# Patient Record
Sex: Female | Born: 1965
Health system: Southern US, Community
[De-identification: ages and names within clinical notes are randomized; demographics above are authoritative.]

## PROBLEM LIST (undated history)

## (undated) DIAGNOSIS — K649 Unspecified hemorrhoids: Secondary | ICD-10-CM

## (undated) DIAGNOSIS — N200 Calculus of kidney: Secondary | ICD-10-CM

## (undated) DIAGNOSIS — Z9289 Personal history of other medical treatment: Secondary | ICD-10-CM

## (undated) DIAGNOSIS — C50919 Malignant neoplasm of unspecified site of unspecified female breast: Secondary | ICD-10-CM

## (undated) DIAGNOSIS — Z923 Personal history of irradiation: Secondary | ICD-10-CM

## (undated) DIAGNOSIS — B019 Varicella without complication: Secondary | ICD-10-CM

## (undated) DIAGNOSIS — K635 Polyp of colon: Secondary | ICD-10-CM

## (undated) DIAGNOSIS — Z1379 Encounter for other screening for genetic and chromosomal anomalies: Secondary | ICD-10-CM

## (undated) DIAGNOSIS — C50911 Malignant neoplasm of unspecified site of right female breast: Secondary | ICD-10-CM

## (undated) DIAGNOSIS — C4491 Basal cell carcinoma of skin, unspecified: Secondary | ICD-10-CM

## (undated) DIAGNOSIS — C50419 Malignant neoplasm of upper-outer quadrant of unspecified female breast: Secondary | ICD-10-CM

## (undated) DIAGNOSIS — N3090 Cystitis, unspecified without hematuria: Secondary | ICD-10-CM

## (undated) DIAGNOSIS — N920 Excessive and frequent menstruation with regular cycle: Secondary | ICD-10-CM

## (undated) DIAGNOSIS — Z853 Personal history of malignant neoplasm of breast: Secondary | ICD-10-CM

## (undated) DIAGNOSIS — C801 Malignant (primary) neoplasm, unspecified: Secondary | ICD-10-CM

## (undated) DIAGNOSIS — G43909 Migraine, unspecified, not intractable, without status migrainosus: Secondary | ICD-10-CM

## (undated) HISTORY — DX: Polyp of colon: K63.5

## (undated) HISTORY — DX: Migraine, unspecified, not intractable, without status migrainosus: G43.909

## (undated) HISTORY — DX: Calculus of kidney: N20.0

## (undated) HISTORY — DX: Encounter for other screening for genetic and chromosomal anomalies: Z13.79

## (undated) HISTORY — DX: Malignant neoplasm of unspecified site of unspecified female breast: C50.919

## (undated) HISTORY — DX: Varicella without complication: B01.9

## (undated) HISTORY — DX: Basal cell carcinoma of skin, unspecified: C44.91

## (undated) HISTORY — DX: Cystitis, unspecified without hematuria: N30.90

## (undated) HISTORY — DX: Excessive and frequent menstruation with regular cycle: N92.0

## (undated) HISTORY — DX: Malignant neoplasm of upper-outer quadrant of unspecified female breast: C50.419

## (undated) HISTORY — DX: Malignant (primary) neoplasm, unspecified: C80.1

## (undated) HISTORY — DX: Unspecified hemorrhoids: K64.9

## (undated) HISTORY — DX: Personal history of other medical treatment: Z92.89

## (undated) HISTORY — DX: Personal history of malignant neoplasm of breast: Z85.3

## (undated) HISTORY — DX: Malignant neoplasm of unspecified site of right female breast: C50.911

---

## 1999-07-09 HISTORY — PX: DILATION AND CURETTAGE OF UTERUS: SHX78

## 2007-11-12 ENCOUNTER — Ambulatory Visit: Payer: Self-pay | Admitting: Unknown Physician Specialty

## 2009-05-08 DIAGNOSIS — C4491 Basal cell carcinoma of skin, unspecified: Secondary | ICD-10-CM

## 2009-05-08 HISTORY — DX: Basal cell carcinoma of skin, unspecified: C44.91

## 2009-05-08 HISTORY — PX: OTHER SURGICAL HISTORY: SHX169

## 2009-07-17 ENCOUNTER — Ambulatory Visit: Payer: Self-pay | Admitting: Unknown Physician Specialty

## 2010-09-07 ENCOUNTER — Ambulatory Visit: Payer: Self-pay | Admitting: Unknown Physician Specialty

## 2011-07-09 DIAGNOSIS — C801 Malignant (primary) neoplasm, unspecified: Secondary | ICD-10-CM

## 2011-07-09 DIAGNOSIS — K635 Polyp of colon: Secondary | ICD-10-CM

## 2011-07-09 DIAGNOSIS — Z923 Personal history of irradiation: Secondary | ICD-10-CM

## 2011-07-09 DIAGNOSIS — Z853 Personal history of malignant neoplasm of breast: Secondary | ICD-10-CM

## 2011-07-09 HISTORY — PX: LASIK: SHX215

## 2011-07-09 HISTORY — PX: BREAST SURGERY: SHX581

## 2011-07-09 HISTORY — DX: Personal history of irradiation: Z92.3

## 2011-07-09 HISTORY — DX: Personal history of malignant neoplasm of breast: Z85.3

## 2011-07-09 HISTORY — DX: Malignant (primary) neoplasm, unspecified: C80.1

## 2011-07-09 HISTORY — DX: Polyp of colon: K63.5

## 2011-07-09 HISTORY — PX: COLONOSCOPY: SHX174

## 2011-10-07 HISTORY — PX: BREAST BIOPSY: SHX20

## 2011-10-14 ENCOUNTER — Ambulatory Visit: Payer: Self-pay | Admitting: Obstetrics and Gynecology

## 2011-10-28 ENCOUNTER — Ambulatory Visit: Payer: Self-pay | Admitting: General Surgery

## 2011-11-05 ENCOUNTER — Ambulatory Visit: Payer: Self-pay | Admitting: General Surgery

## 2011-11-05 DIAGNOSIS — C50419 Malignant neoplasm of upper-outer quadrant of unspecified female breast: Secondary | ICD-10-CM

## 2011-11-05 HISTORY — PX: BREAST LUMPECTOMY: SHX2

## 2011-11-05 HISTORY — DX: Malignant neoplasm of upper-outer quadrant of unspecified female breast: C50.419

## 2011-11-05 LAB — PREGNANCY, URINE: Pregnancy Test, Urine: NEGATIVE m[IU]/mL

## 2011-11-06 LAB — PATHOLOGY REPORT

## 2011-11-14 ENCOUNTER — Ambulatory Visit: Payer: Self-pay | Admitting: Oncology

## 2011-12-07 ENCOUNTER — Ambulatory Visit: Payer: Self-pay | Admitting: Oncology

## 2011-12-16 LAB — CBC CANCER CENTER
Basophil %: 0.8 %
Lymphocyte %: 34 %
Monocyte #: 0.8 x10 3/mm (ref 0.2–0.9)
Monocyte %: 11.6 %
Neutrophil #: 3.4 x10 3/mm (ref 1.4–6.5)
Neutrophil %: 50.3 %
RBC: 4.32 10*6/uL (ref 3.80–5.20)
WBC: 6.7 x10 3/mm (ref 3.6–11.0)

## 2011-12-16 LAB — COMPREHENSIVE METABOLIC PANEL
BUN: 14 mg/dL (ref 7–18)
Bilirubin,Total: 0.3 mg/dL (ref 0.2–1.0)
Chloride: 104 mmol/L (ref 98–107)
Co2: 28 mmol/L (ref 21–32)
Creatinine: 0.54 mg/dL — ABNORMAL LOW (ref 0.60–1.30)
EGFR (African American): >60
Glucose: 96 mg/dL (ref 65–99)
Potassium: 3.8 mmol/L (ref 3.5–5.1)
SGOT(AST): 24 U/L (ref 15–37)
SGPT (ALT): 34 U/L
Sodium: 140 mmol/L (ref 136–145)
Total Protein: 7.6 g/dL (ref 6.4–8.2)

## 2012-01-06 ENCOUNTER — Ambulatory Visit: Payer: Self-pay | Admitting: Oncology

## 2012-01-06 LAB — CBC CANCER CENTER
Basophil #: 0.1 x10 3/mm (ref 0.0–0.1)
Basophil %: 0.9 %
HGB: 12.3 g/dL (ref 12.0–16.0)
Lymphocyte %: 42.1 %
MCH: 30.4 pg (ref 26.0–34.0)
MCV: 95 fL (ref 80–100)
Monocyte #: 0.8 x10 3/mm (ref 0.2–0.9)
Monocyte %: 13.1 %
Neutrophil #: 2.4 x10 3/mm (ref 1.4–6.5)
Neutrophil %: 40.3 %
RBC: 4.05 10*6/uL (ref 3.80–5.20)
RDW: 13.4 % (ref 11.5–14.5)
WBC: 5.9 x10 3/mm (ref 3.6–11.0)

## 2012-01-13 LAB — CBC CANCER CENTER
Basophil #: 0 x10 3/mm (ref 0.0–0.1)
Eosinophil #: 0.2 x10 3/mm (ref 0.0–0.7)
Eosinophil %: 4 %
HCT: 38.7 % (ref 35.0–47.0)
HGB: 12.7 g/dL (ref 12.0–16.0)
Lymphocyte #: 2 x10 3/mm (ref 1.0–3.6)
Lymphocyte %: 33.6 %
MCHC: 32.9 g/dL (ref 32.0–36.0)
Monocyte #: 0.8 x10 3/mm (ref 0.2–0.9)
Neutrophil #: 2.8 x10 3/mm (ref 1.4–6.5)
RBC: 4.16 10*6/uL (ref 3.80–5.20)
RDW: 13.1 % (ref 11.5–14.5)

## 2012-01-20 LAB — CBC CANCER CENTER
Basophil #: 0 x10 3/mm (ref 0.0–0.1)
Eosinophil %: 3.3 %
Lymphocyte #: 2 x10 3/mm (ref 1.0–3.6)
MCHC: 31.7 g/dL — ABNORMAL LOW (ref 32.0–36.0)
MCV: 94 fL (ref 80–100)
Monocyte #: 1 x10 3/mm — ABNORMAL HIGH (ref 0.2–0.9)
Monocyte %: 16.1 %
Neutrophil #: 3.2 x10 3/mm (ref 1.4–6.5)
RBC: 4.03 10*6/uL (ref 3.80–5.20)
RDW: 13.4 % (ref 11.5–14.5)

## 2012-01-27 LAB — CBC CANCER CENTER
Basophil #: 0.1 x10 3/mm (ref 0.0–0.1)
Basophil %: 1 %
Eosinophil %: 4 %
HGB: 12.6 g/dL (ref 12.0–16.0)
Lymphocyte #: 1.6 x10 3/mm (ref 1.0–3.6)
MCH: 30.2 pg (ref 26.0–34.0)
MCHC: 32.2 g/dL (ref 32.0–36.0)
MCV: 94 fL (ref 80–100)
Monocyte #: 0.8 x10 3/mm (ref 0.2–0.9)
RBC: 4.17 10*6/uL (ref 3.80–5.20)

## 2012-02-03 LAB — CBC CANCER CENTER
Basophil #: 0 x10 3/mm (ref 0.0–0.1)
Basophil %: 1 %
Eosinophil #: 0.3 x10 3/mm (ref 0.0–0.7)
Eosinophil %: 5.4 %
Lymphocyte %: 30.7 %
MCH: 31.5 pg (ref 26.0–34.0)
MCHC: 33.7 g/dL (ref 32.0–36.0)
Monocyte %: 16.2 %
Neutrophil #: 2.2 x10 3/mm (ref 1.4–6.5)
Neutrophil %: 46.7 %
RBC: 4.01 10*6/uL (ref 3.80–5.20)

## 2012-02-06 ENCOUNTER — Ambulatory Visit: Payer: Self-pay | Admitting: Oncology

## 2012-02-10 LAB — CBC CANCER CENTER
Basophil #: 0 x10 3/mm (ref 0.0–0.1)
Basophil %: 0.8 %
Eosinophil %: 5.1 %
HCT: 38.4 % (ref 35.0–47.0)
Lymphocyte %: 27.1 %
MCV: 93 fL (ref 80–100)
Monocyte %: 17.2 %
Neutrophil #: 2.4 x10 3/mm (ref 1.4–6.5)
Neutrophil %: 49.8 %
RBC: 4.13 10*6/uL (ref 3.80–5.20)
RDW: 13.6 % (ref 11.5–14.5)

## 2012-02-17 LAB — CBC CANCER CENTER
Basophil %: 1.1 %
Eosinophil %: 5.2 %
HCT: 38.6 % (ref 35.0–47.0)
HGB: 12.8 g/dL (ref 12.0–16.0)
Lymphocyte #: 1.2 x10 3/mm (ref 1.0–3.6)
MCH: 31 pg (ref 26.0–34.0)
MCHC: 33.1 g/dL (ref 32.0–36.0)
MCV: 94 fL (ref 80–100)
Monocyte #: 0.9 x10 3/mm (ref 0.2–0.9)
Neutrophil #: 2.2 x10 3/mm (ref 1.4–6.5)
Neutrophil %: 49.1 %

## 2012-02-24 LAB — CBC CANCER CENTER
Basophil #: 0.1 x10 3/mm (ref 0.0–0.1)
Basophil %: 1.2 %
Eosinophil %: 5 %
HCT: 36.8 % (ref 35.0–47.0)
HGB: 11.9 g/dL — ABNORMAL LOW (ref 12.0–16.0)
Lymphocyte #: 1.1 x10 3/mm (ref 1.0–3.6)
Lymphocyte %: 25.4 %
MCHC: 32.2 g/dL (ref 32.0–36.0)
MCV: 94 fL (ref 80–100)
Monocyte %: 16.5 %
RBC: 3.93 10*6/uL (ref 3.80–5.20)

## 2012-03-08 ENCOUNTER — Ambulatory Visit: Payer: Self-pay | Admitting: Oncology

## 2012-04-06 ENCOUNTER — Ambulatory Visit: Payer: Self-pay | Admitting: Gastroenterology

## 2012-04-15 ENCOUNTER — Ambulatory Visit: Payer: Self-pay | Admitting: General Surgery

## 2012-07-16 ENCOUNTER — Ambulatory Visit: Payer: Self-pay | Admitting: Oncology

## 2012-07-16 LAB — COMPREHENSIVE METABOLIC PANEL
Alkaline Phosphatase: 60 U/L (ref 50–136)
BUN: 12 mg/dL (ref 7–18)
Calcium, Total: 8.2 mg/dL — ABNORMAL LOW (ref 8.5–10.1)
Chloride: 105 mmol/L (ref 98–107)
Creatinine: 0.68 mg/dL (ref 0.60–1.30)
EGFR (African American): >60
EGFR (Non-African Amer.): >60
Osmolality: 283 (ref 275–301)
Sodium: 142 mmol/L (ref 136–145)
Total Protein: 7.1 g/dL (ref 6.4–8.2)

## 2012-07-16 LAB — CBC CANCER CENTER
Basophil #: 0.1 x10 3/mm (ref 0.0–0.1)
Basophil %: 1 %
Eosinophil #: 0.2 x10 3/mm (ref 0.0–0.7)
HGB: 11.9 g/dL — ABNORMAL LOW (ref 12.0–16.0)
Lymphocyte #: 1.8 x10 3/mm (ref 1.0–3.6)
Lymphocyte %: 32.6 %
MCH: 31.5 pg (ref 26.0–34.0)
MCV: 94 fL (ref 80–100)
Neutrophil %: 47.2 %
RBC: 3.78 10*6/uL — ABNORMAL LOW (ref 3.80–5.20)
WBC: 5.5 x10 3/mm (ref 3.6–11.0)

## 2012-08-08 ENCOUNTER — Ambulatory Visit: Payer: Self-pay | Admitting: Oncology

## 2012-09-05 ENCOUNTER — Encounter: Payer: Self-pay | Admitting: *Deleted

## 2012-09-05 ENCOUNTER — Ambulatory Visit: Payer: Self-pay | Admitting: Oncology

## 2012-10-15 ENCOUNTER — Ambulatory Visit: Payer: Self-pay | Admitting: General Surgery

## 2012-10-20 ENCOUNTER — Encounter: Payer: Self-pay | Admitting: General Surgery

## 2012-10-26 ENCOUNTER — Other Ambulatory Visit: Payer: Self-pay | Admitting: *Deleted

## 2012-10-26 ENCOUNTER — Encounter: Payer: Self-pay | Admitting: General Surgery

## 2012-10-26 ENCOUNTER — Encounter: Payer: Self-pay | Admitting: *Deleted

## 2012-10-26 ENCOUNTER — Ambulatory Visit (INDEPENDENT_AMBULATORY_CARE_PROVIDER_SITE_OTHER): Payer: BC Managed Care – PPO | Admitting: General Surgery

## 2012-10-26 VITALS — BP 120/70 | HR 76 | Resp 14 | Ht 65.0 in | Wt 131.0 lb

## 2012-10-26 DIAGNOSIS — C50919 Malignant neoplasm of unspecified site of unspecified female breast: Secondary | ICD-10-CM

## 2012-10-26 DIAGNOSIS — Z853 Personal history of malignant neoplasm of breast: Secondary | ICD-10-CM

## 2012-10-26 DIAGNOSIS — C50911 Malignant neoplasm of unspecified site of right female breast: Secondary | ICD-10-CM

## 2012-10-26 NOTE — Patient Instructions (Addendum)
Patient to follow up in 1 year with bilateral diagnostic mammogram. Patient to call if any questions or concerns arise.

## 2012-10-26 NOTE — Progress Notes (Signed)
The patient has been asked to return to the office in one year for a bilateral diagnostic mammogram. 

## 2012-10-26 NOTE — Progress Notes (Signed)
Patient ID: Cynthia Willis, female   DOB: [birth year], 47 y.o.   MRN: [id number]  Chief Complaint  Patient presents with  . Follow-up    mammogram    HPI Cynthia Willis is a 47 y.o. female here today for her follow up mammogram done on 10/15/12 at [facility] with birad category 2. Patient performs regular self breast checks as well as gets regular mammograms. She has a family history of both ovarian and breast cancer. She has a history of right breast cancer with treatment of a lumpectomy and radiation. No new breast problems at this time. No complaints at this time.   HPI  Past Medical History  Diagnosis Date  . Personal history of malignant neoplasm of breast 2013     right  . Cystitis   . Cancer 2013    November 05, 2011 wide excision and sentinel node biopsy for invasive ductal carcinoma involving the right breast.    Past Surgical History  Procedure Laterality Date  . Colonoscopy  2013    hyperplastic polyp identified in the sigmoid  04/21/2012  , Dr. Ricki Rodriguez  . Breast surgery Right 2013    wide excision  . Lasik  2013  . Dilation and curettage of uterus  2001    Family History  Problem Relation Age of Onset  . Colon cancer Father   . Cancer Maternal Aunt     breast    Social History History  Substance Use Topics  . Smoking status: Never Smoker   . Smokeless tobacco: Never Used  . Alcohol Use: No    No Known Allergies  Current Outpatient Prescriptions  Medication Sig Dispense Refill  . aspirin 81 MG tablet Take 81 mg by mouth daily.      . Cholecalciferol (VITAMIN D-3) 5000 UNITS TABS Take by mouth.      . tamoxifen (NOLVADEX) 20 MG tablet Take 20 mg by mouth daily.       No current facility-administered medications for this visit.    Review of Systems Review of Systems  Constitutional: Negative.   Respiratory: Negative.   Cardiovascular: Negative.     Blood pressure 120/70, pulse 76, resp. rate 14, height 5\' 5"  (1.651 m), weight 131 lb (59.421 kg), last menstrual  period 10/26/2012.  Physical Exam Physical Exam  Constitutional: She appears well-developed and well-nourished.  Neck: Trachea normal. No mass and no thyromegaly present.  Cardiovascular: Normal rate, regular rhythm, normal heart sounds and normal pulses.   No murmur heard. Pulmonary/Chest: Effort normal and breath sounds normal. Right breast exhibits tenderness. Right breast exhibits no inverted nipple, no mass, no nipple discharge and no skin change. Left breast exhibits no inverted nipple, no mass, no nipple discharge, no skin change and no tenderness.  Lymphadenopathy:    She has no cervical adenopathy.    She has no axillary adenopathy.    Data Reviewed October 15, 2012 mammograms were reviewed. Notable change. BI-RAD 2.  Assessment    Doing well status post treatment of her right breast cancer.     Plan    We discussed the pros and cons of 6 month followup mammograms. Most recently she does not suggest that this study adds to patient's care, only expense. At this time we'll plan for followup mammogram in one year.        Cynthia Willis 10/27/2012, 9:50 PM

## 2012-10-27 ENCOUNTER — Encounter: Payer: Self-pay | Admitting: General Surgery

## 2012-10-27 DIAGNOSIS — C50911 Malignant neoplasm of unspecified site of right female breast: Secondary | ICD-10-CM | POA: Insufficient documentation

## 2012-12-23 ENCOUNTER — Ambulatory Visit: Payer: Self-pay | Admitting: Oncology

## 2013-01-05 ENCOUNTER — Ambulatory Visit: Payer: Self-pay | Admitting: Oncology

## 2013-01-05 ENCOUNTER — Ambulatory Visit: Payer: Self-pay | Admitting: Obstetrics and Gynecology

## 2013-01-28 LAB — COMPREHENSIVE METABOLIC PANEL
Albumin: 3.4 g/dL (ref 3.4–5.0)
Anion Gap: 5 — ABNORMAL LOW (ref 7–16)
BUN: 16 mg/dL (ref 7–18)
Calcium, Total: 8.4 mg/dL — ABNORMAL LOW (ref 8.5–10.1)
Co2: 33 mmol/L — ABNORMAL HIGH (ref 21–32)
Creatinine: 0.65 mg/dL (ref 0.60–1.30)
EGFR (Non-African Amer.): >60
SGPT (ALT): 34 U/L (ref 12–78)
Sodium: 142 mmol/L (ref 136–145)

## 2013-01-28 LAB — CBC CANCER CENTER
Basophil #: 0 x10 3/mm (ref 0.0–0.1)
Basophil %: 0.7 %
HCT: 35 % (ref 35.0–47.0)
Lymphocyte #: 1.8 x10 3/mm (ref 1.0–3.6)
Lymphocyte %: 36.2 %
MCH: 31 pg (ref 26.0–34.0)
MCHC: 33.5 g/dL (ref 32.0–36.0)
Monocyte #: 0.7 x10 3/mm (ref 0.2–0.9)
Neutrophil %: 45.4 %

## 2013-02-05 ENCOUNTER — Ambulatory Visit: Payer: Self-pay | Admitting: Oncology

## 2013-03-30 ENCOUNTER — Ambulatory Visit: Payer: Self-pay | Admitting: Oncology

## 2013-04-07 ENCOUNTER — Ambulatory Visit: Payer: Self-pay | Admitting: Oncology

## 2013-05-20 ENCOUNTER — Telehealth: Payer: Self-pay | Admitting: *Deleted

## 2013-05-20 NOTE — Telephone Encounter (Signed)
Pt asked for name of the shoe that was recommended, by her doctor.  I call [phone] informed her of [employer] and [employer].

## 2013-05-26 ENCOUNTER — Ambulatory Visit: Payer: Self-pay | Admitting: Oncology

## 2013-06-29 ENCOUNTER — Ambulatory Visit: Payer: Self-pay | Admitting: Oncology

## 2013-07-08 ENCOUNTER — Ambulatory Visit: Payer: Self-pay | Admitting: Oncology

## 2013-08-18 ENCOUNTER — Ambulatory Visit: Payer: Self-pay | Admitting: Oncology

## 2013-09-27 ENCOUNTER — Ambulatory Visit: Payer: Self-pay | Admitting: Oncology

## 2013-10-06 ENCOUNTER — Ambulatory Visit: Payer: Self-pay | Admitting: Oncology

## 2013-10-22 ENCOUNTER — Ambulatory Visit: Payer: Self-pay | Admitting: General Surgery

## 2013-10-27 ENCOUNTER — Ambulatory Visit (INDEPENDENT_AMBULATORY_CARE_PROVIDER_SITE_OTHER): Payer: BC Managed Care – PPO | Admitting: General Surgery

## 2013-10-27 ENCOUNTER — Ambulatory Visit: Payer: BC Managed Care – PPO | Admitting: General Surgery

## 2013-10-27 ENCOUNTER — Encounter: Payer: Self-pay | Admitting: General Surgery

## 2013-10-27 VITALS — BP 110/78 | HR 68 | Resp 12 | Ht 65.0 in | Wt 126.0 lb

## 2013-10-27 DIAGNOSIS — Z853 Personal history of malignant neoplasm of breast: Secondary | ICD-10-CM

## 2013-10-27 NOTE — Progress Notes (Signed)
Patient ID: Cynthia Willis, female   DOB: 11-26-1965, 48 y.o.   MRN: [id number]  Chief Complaint  Patient presents with  . Follow-up    mammoram    HPI Cynthia Willis is a 48 y.o. female who presents for a breast evaluation. The most recent mammogram was done on 10/22/13. Patient does perform regular self breast checks and gets regular mammograms done.   In August 2014 the patient was taken off tamoxifen and placed on Lupron with examestane as an aromatase tender. This was done for her regular menses.  The patient had been seen in consultation at [facility], and ovarian removal as been recommended. She is tentatively scheduled to have bilateral ovary removal completed locally by [name], M.D. In the near future.  HPI  Past Medical History  Diagnosis Date  . Personal history of malignant neoplasm of breast 2013     right  . Cystitis   . Cancer 2013    November 05, 2011 wide excision and sentinel node biopsy for invasive ductal carcinoma involving the right breast.  . Malignant neoplasm of upper-outer quadrant of female breast November 05, 2011    T1C, N0, M0; ER 90%, PR 90%, HER-2/neu not over expressed. Oncotype DX: low risk of recurrence with antiestrogen alone at 8%.    Past Surgical History  Procedure Laterality Date  . Colonoscopy  2013    hyperplastic polyp identified in the sigmoid  04/21/2012  , Dr. [name]  . Breast surgery Right 2013    wide excision  . Lasik  2013  . Dilation and curettage of uterus  2001    Family History  Problem Relation Age of Onset  . Colon cancer Father   . Cancer Maternal Aunt     breast    Social History History  Substance Use Topics  . Smoking status: Never Smoker   . Smokeless tobacco: Never Used  . Alcohol Use: No    No Known Allergies  Current Outpatient Prescriptions  Medication Sig Dispense Refill  . aspirin 81 MG tablet Take 81 mg by mouth daily.      . calcium carbonate (OS-CAL) 600 MG TABS tablet Take 600 mg by mouth 2  (two) times daily with a meal.      . Cholecalciferol (VITAMIN D-3) 5000 UNITS TABS Take by mouth.      [name] exemestane (AROMASIN) 25 MG tablet Take 1 tablet by mouth daily.      [name] Leuprolide Acetate (LUPRON IJ) Inject 1 mL as directed. Once every three month       No current facility-administered medications for this visit.    Review of Systems Review of Systems  Constitutional: Negative.   Respiratory: Negative.   Cardiovascular: Negative.     Blood pressure 110/78, pulse 68, resp. rate 12, height 5' 5" (1.651 m), weight 126 lb (57.153 kg).  Physical Exam Physical Exam  Constitutional: She is oriented to person, place, and time. She appears well-developed and well-nourished.  Eyes: Conjunctivae are normal.  Neck: Neck supple.  Cardiovascular: Normal rate, regular rhythm and normal heart sounds.   Pulmonary/Chest: Effort normal and breath sounds normal. Right breast exhibits no inverted nipple, no mass, no nipple discharge, no skin change and no tenderness. Left breast exhibits no inverted nipple, no mass, no nipple discharge, no skin change and no tenderness.  Tenderness and thickening right breast near scar  Abdominal: Bowel sounds are normal.  Lymphadenopathy:    She has no cervical adenopathy.    She has  no axillary adenopathy.  Neurological: She is alert and oriented to person, place, and time.  Skin: Skin is warm and dry.    Data Reviewed Bilateral mammogram dated October 22, 2013 were reviewed and compared to studies dating back to prior to her original surgery. No underwent change. BI-RAD-2.  Assessment    Doing well now 2 years status post conservative treatment of a left breast cancer.     Plan    The patient will likely remain on an aromatase inhibitor after her bilateral ovary removal. The importance of adequate vitamin D and calcium was emphasized as well as biannual bone density testing. She is very on to lose her hormonal support for bone density. While aromatase  inhibitor is to provide some improvement in cancer suppression compared to approximate, the latter does promote improving bone density. She'll discuss this with her medical oncologist.          Forest Gleason Shantrice Rodenberg 10/28/2013, 4:18 PM

## 2013-10-27 NOTE — Patient Instructions (Signed)
Patient to return in one year bilateral diagnotic mammogram.  

## 2013-10-28 ENCOUNTER — Encounter: Payer: Self-pay | Admitting: General Surgery

## 2013-10-28 DIAGNOSIS — Z853 Personal history of malignant neoplasm of breast: Secondary | ICD-10-CM | POA: Insufficient documentation

## 2013-11-03 ENCOUNTER — Ambulatory Visit: Payer: Self-pay | Admitting: Obstetrics & Gynecology

## 2013-11-03 LAB — CBC
HCT: 38.5 % (ref 35.0–47.0)
HGB: 12.6 g/dL (ref 12.0–16.0)
MCH: 29.6 pg (ref 26.0–34.0)
MCHC: 32.8 g/dL (ref 32.0–36.0)
MCV: 90 fL (ref 80–100)
Platelet: 219 10*3/uL (ref 150–440)
RBC: 4.27 10*6/uL (ref 3.80–5.20)
RDW: 14.9 % — ABNORMAL HIGH (ref 11.5–14.5)
WBC: 4.8 10*3/uL (ref 3.6–11.0)

## 2013-11-03 LAB — HM PAP SMEAR

## 2013-11-09 ENCOUNTER — Ambulatory Visit: Payer: Self-pay | Admitting: Obstetrics & Gynecology

## 2013-11-09 HISTORY — PX: OOPHORECTOMY: SHX86

## 2013-11-09 HISTORY — PX: LAPAROSCOPY: SHX197

## 2013-11-10 LAB — BASIC METABOLIC PANEL
Anion Gap: 4 — ABNORMAL LOW (ref 7–16)
BUN: 10 mg/dL (ref 7–18)
CHLORIDE: 107 mmol/L (ref 98–107)
Calcium, Total: 8.7 mg/dL (ref 8.5–10.1)
Co2: 29 mmol/L (ref 21–32)
Creatinine: 0.71 mg/dL (ref 0.60–1.30)
EGFR (Non-African Amer.): >60
Glucose: 133 mg/dL — ABNORMAL HIGH (ref 65–99)
Osmolality: 280 (ref 275–301)
Potassium: 4 mmol/L (ref 3.5–5.1)
SODIUM: 140 mmol/L (ref 136–145)

## 2013-11-10 LAB — HEMOGLOBIN: HGB: 11.4 g/dL — AB (ref 12.0–16.0)

## 2013-11-12 LAB — PATHOLOGY REPORT

## 2013-11-23 ENCOUNTER — Encounter: Payer: Self-pay | Admitting: General Surgery

## 2013-12-09 ENCOUNTER — Encounter: Payer: Self-pay | Admitting: General Surgery

## 2013-12-27 ENCOUNTER — Ambulatory Visit: Payer: Self-pay | Admitting: Oncology

## 2014-01-05 ENCOUNTER — Ambulatory Visit: Payer: Self-pay | Admitting: Oncology

## 2014-05-09 ENCOUNTER — Encounter: Payer: Self-pay | Admitting: General Surgery

## 2014-07-13 ENCOUNTER — Ambulatory Visit: Payer: Self-pay | Admitting: Oncology

## 2014-08-08 ENCOUNTER — Ambulatory Visit: Payer: Self-pay | Admitting: Oncology

## 2014-09-06 ENCOUNTER — Ambulatory Visit
Admit: 2014-09-06 | Disposition: A | Discharge status: Home / Self Care | Payer: Self-pay | Attending: Oncology | Admitting: Oncology

## 2014-09-08 ENCOUNTER — Ambulatory Visit (INDEPENDENT_AMBULATORY_CARE_PROVIDER_SITE_OTHER): Payer: BLUE CROSS/BLUE SHIELD | Admitting: Podiatry

## 2014-09-08 ENCOUNTER — Encounter: Payer: Self-pay | Admitting: Podiatry

## 2014-09-08 ENCOUNTER — Ambulatory Visit (INDEPENDENT_AMBULATORY_CARE_PROVIDER_SITE_OTHER): Payer: BLUE CROSS/BLUE SHIELD

## 2014-09-08 VITALS — BP 112/76 | HR 72 | Resp 16 | Ht 65.0 in | Wt 132.0 lb

## 2014-09-08 DIAGNOSIS — L84 Corns and callosities: Secondary | ICD-10-CM

## 2014-09-08 DIAGNOSIS — M2042 Other hammer toe(s) (acquired), left foot: Secondary | ICD-10-CM | POA: Diagnosis not present

## 2014-09-08 NOTE — Progress Notes (Signed)
Patient ID: Cynthia Willis, female   DOB: May 12, 1966, 49 y.o.   MRN: [id number]  Subjective: 49 year old female presents the office today with complaints of a painful lesion on the inside aspect of her left fifth toe. She states that she has had a corn develop overlying this part of her toe for the last several years. She states that the area only is symptomatically in the wintertime as she wears closed in shoes. She states that she has no discomfort with open toed shoes or if there is no pressure to the area. She denies any redness or drainage along the site of the cord. Denies any history of injury or trauma. No other complaints at this time.  Objective: AAO x3, NAD DP/PT pulses palpable bilaterally, CRT less than 3 seconds Protective sensation intact with Simms Weinstein monofilament, vibratory sensation intact, Achilles tendon reflex intact There is a hyperkeratotic lesion along the medial aspect the left fifth digit approximately level the PIPJ. Upon debridement a lesion there is no underlying ulceration, drainage or other clinical signs of infection. There is mild discomfort upon direct palpation of the hyperkeratotic lesion there is no areas of pinpoint bony tenderness or pain the vibratory sensation. There is no overlying edema, erythema, increase in warmth. There is adductovarus deformity of the fourth and fifth digits bilaterally. There is no significant hyperkeratotic lesions on the contralateral extremity. No other areas of tenderness to bilateral lower extremities. MMT 5/5, ROM WNL.  No open lesions or other pre-ulcerative lesions.  No overlying edema, erythema, increase in warmth to bilateral lower extremities.  No pain with calf compression, swelling, warmth, erythema bilaterally.   Assessment: 49 year old female with hyperkeratotic lesion left fifth digit due to underlying toe deformity  Plan: -X-rays were obtained and reviewed with the patient. -Conservative and surgical treatment  options were discussed with the patient include alternatives, risks, complications -Discussed likely etiology the patient symptoms. -Lesion lightly debrided without complication/bleeding. -Offloading pad was dispensed. -Discussed shoe gear modifications. -Follow-up as needed. In the meantime encouraged all the office and requests, concerns, change in symptoms.

## 2014-09-13 ENCOUNTER — Encounter: Payer: Self-pay | Admitting: General Surgery

## 2014-10-29 NOTE — Op Note (Signed)
PATIENT NAME:  Cynthia Willis, COLELLA MR#:  [id number] DATE OF BIRTH:  1965/10/29  DATE OF PROCEDURE:  11/09/2013  PREOPERATIVE DIAGNOSIS:  History of breast cancer prevention of recurrence.    POSTOPERATIVE DIAGNOSIS:  History of breast cancer prevention of recurrence.  Also, inflammation and possible endometriosis of the ovaries.   PROCEDURE PERFORMED:  Operative laparoscopy with bilateral salpingo-oophorectomy, cystoscopy and removal of IUD.   SURGEON:  [name], M.D.   ANESTHESIA:  General.   BLOOD LOSS:  200 mL.   COMPLICATIONS:  None.   FINDINGS:  Bilateral ovaries with evidence for inflammation and endometriosis that are also adherent to the pelvic sidewall.  Normal uterus, appendix, gallbladder and liver.   DISPOSITION:  To recovery room in stable condition.   TECHNIQUE:  The patient is prepped and draped in the usual sterile fashion after adequate anesthesia is obtained in the dorsal lithotomy position.  Bladder is drained with a Robinson catheter.  Sponge stick is placed in the vagina.   A small incision was made just inferior to the umbilicus down to the level of the rectus fascia which is then tented anteriorly after being grasped with Kocher clamps.  A small incision is made with entry to the intra-abdominal cavity.  Careful visual and palpable exploration reveals no adherent bowel in this area.  The quad-port is then placed and cinched into place.  The 10 mm laparoscope was then inserted as the abdomen is inflated with CO2 gas.  The patient was placed in Trendelenburg positioning and the above-mentioned findings are visualized.  Laparoscopic instruments including the Harmonic scalpel was then utilized to perform the procedure through the [place].  The infundibular blood vessels and their ligaments are carefully coagulated and cut using the 5 mm Harmonic scalpel and then the adnexa is dissected along the mesosalpinx to the level of the uterine ovarian vessels which are also coagulated  and cut and the tube is amputated in this portion.  On the right side the ovary is stuck to the sidewall in a significant manner and a careful lysis of adhesions performed and the ovary is taken down.  The ureter is visualized to peristalse and not be harmed during this part of the procedure.  There is oozing and bleeding followed this site and using Bovie electrocautery and Arista hemostasis is achieved.  Irrigation is performed with copious amounts of saline.  The left side has less amounts of adhesions, but it does have a fair amount of adhesions in the same area.  There is minimal bleeding on the left side.  The ovaries and tubes are removed through an Endopouch and sent to pathology for further review.   The patient observed for a long while to make sure that adequate hemostasis is assured.  Once it is, then the patient is leveled, the trocar is removed and the fascia is closed with a 2-0 Vicryl suture and the skin with 4-0 Vicryl suture and Dermabond.  Cystoscopy is then performed with saline distention of the bladder and urine is seen to emit through each ureteral orifice.  The bladder is then drained completely before removing the cystoscope.  The patient goes to the recovery room in stable condition.  All sponge, instrument, and needle counts are correct.     ____________________________ R. [name], MD rph:ea D: 11/09/2013 15:48:26 ET T: 11/10/2013 02:13:38 ET JOB#: [id number]  cc: Glean Salen, MD, <Dictator> Gae Dry MD ELECTRONICALLY SIGNED 11/16/2013 8:02

## 2014-10-30 NOTE — Op Note (Signed)
PATIENT NAME:  Cynthia Willis, Cynthia Willis MR#:  [id number] DATE OF BIRTH:  1966/06/16  DATE OF PROCEDURE:  11/05/2011  PREOPERATIVE DIAGNOSIS: Right breast cancer.   POSTOPERATIVE DIAGNOSIS: Right breast cancer.   OPERATIVE PROCEDURE: Wide local excision with mastoplasty, sentinel lymph node biopsy.   OPERATING SURGEON: Robert Bellow, MD   ANESTHESIA: General by LMA under Dr. Ronelle Nigh, Marcaine 0.5% with 1:200,000 units epinephrine, 30 mL local infiltration.   ESTIMATED BLOOD LOSS: Minimal.   CLINICAL NOTE: This 49 year old woman was recently identified with a breast mass. Core biopsy showed evidence of invasive mammary cancer. Given her options for management, she elected breast conservation. She was injected with technetium sulfur colloid prior to the procedure.   OPERATIVE NOTE: With the patient under adequate general anesthesia, alcohol was used to prep the periareolar skin and 3 mL of methylene blue diluted 1:2 with normal saline was injected in the subareolar plexus. The breast was then prepped with ChloraPrep and draped. The axilla and lateral chest were prepped as well.   Attention was turned to the axilla. The gamma finder was used to show an area of increased uptake. A small 3 cm transverse incision was made after infiltration of local anesthetic. The axillary envelope was opened and a 1 cm blue, hot lymph node was identified. An additional 6 mm hot, non-blue lymph node was removed. Both were reported as negative for macro metastases on touch prep analysis by Quay Burow, MD. The wound was closed in layers with 2-0 Vicryl in the deep tissue and a running 4-0 Vicryl subcuticular suture for the skin.   Attention was turned to the breast primary. Ultrasound showed that it abutted the overlying dermis and extended almost to the pectoralis fascia in this very modest volume breast. The area of the tumor mass was outlined +5 mm in all directions. A 3 x 9 cm ellipse of skin was marked to allow  complete resection of the area. This was carried down to and included the pectoralis fascia. The specimen was orientated for mammographic examination of the specimen. This confirmed a mass in the center of the specimen. Gross examination showed clear margins.   Minimizing superior traction on the nipple, the breast was elevated off the underlying pectoralis fascia to approximately the costochondral junction medially and to the serratus fascia inferiorly and approximately two-thirds of the way to the clavicle and to the rectus fascia. The breast parenchyma was then approximated in multiple layers with 2-0 Vicryl figure-of-eight sutures in a radial fashion. The adipose tissue was then elevated off the breast parenchyma and closed transversely with similar sutures. The wound was then closed with a running 4-0 Vicryl subcuticular suture. Benzoin and Steri-Strips were applied. Fluff gauze dressing followed by a Kerlix and an Ace wrap was then placed.   The patient tolerated the procedure well and was taken to the recovery room in stable condition.   ____________________________ Robert Bellow, MD jwb:drc D: 11/05/2011 19:27:57 ET T: 11/06/2011 10:34:06 ET JOB#: [id number]  cc: Robert Bellow, MD, <Dictator> Delsa Sale, MD Diedra Sinor Amedeo Kinsman MD ELECTRONICALLY SIGNED 11/08/2011 13:09

## 2014-10-30 NOTE — Consult Note (Signed)
Reason for Visit: This 49 year old Female patient presents to the clinic for initial evaluation of  Breast cancer .   Referred by Dr. Terri Piedra.  Diagnosis:   Chief Complaint/Diagnosis   49 year old female status was wide local excision and sentinel node biopsy for a clinical stage I (T1 C. N0 M0) ER/PR positive HER-2/neu negative invasive mammary carcinoma   Pathology Report Pathology were reviewed    Imaging Report Mammograms ultrasound reviewed    Referral Report Clinical notes reviewed    Planned Treatment Regimen Whole breast radiation plus minus chemotherapy based on Oncotype DX    HPI   patient is a 49 year old female who presented with a self discovered mass in her right breast. She had an abnormal mammogram and ultrasound. Underwent core biopsy by Dr. Tollie Pizza positive for invasive mammary carcinoma ER/PR positive HER-2/neu negative. She underwent a wide local excision and sentinel node biopsy. Tumor was 1.6 cm in greatest dimension. Margins were clear but close at 2 mm. 2 sentinel lymph nodes were negative for metastatic disease.tumor overall is in nuclear grade 2. No angiolymphatic invasion was noted. Patient tolerated surgery well. She seen today by both medical oncology and radiation oncology for consideration of adjuvant treatment. She has had genetic testing with those results pending.  Past Hx:    Anxiety: r/t breast cancer dx   Right Breast Cancer: Apr 2013   Dilation and Curretage:   Past, Family and Social History:   Past Medical History positive    Neurological/Psychiatric anxiety    Past Surgical History D&C    Family History noncontributory    Social History noncontributory    Additional Past Medical and Surgical History Accompanied by nurse navigator today   Allergies:   No Known Allergies:   Home Meds:  Home Medications: Medication Instructions Status  Mirena  vaginal  Active  Xanax 0.5 mg oral tablet 0.5 tab(s) orally , As Needed Active   diphenhydrAMINE 25 mg oral tablet 1 tab(s) orally once a day (at bedtime) Active  Vitamin D3 50,000 intl units oral capsule 1 cap(s) orally once a week Active  Aleve 220 mg oral tablet 1 tab(s) orally every 12 hours,for Pain Active   Review of Systems:   General negative    Performance Status (ECOG) 0    Skin negative    Breast see HPI    Ophthalmologic negative    ENMT negative    Respiratory and Thorax negative    Cardiovascular negative    Gastrointestinal negative    Genitourinary negative    Musculoskeletal negative    Neurological negative    Psychiatric see HPI    Hematology/Lymphatics negative    Endocrine negative    Allergic/Immunologic negative   Nursing Notes:  Nursing Vital Signs and Chemo Nursing Nursing Notes: *CC Vital Signs Flowsheet:   09-May-13 13:39   Temp Temperature 98.6   Pulse Pulse 77   SBP SBP 110   DBP DBP 73   Pain Scale (0-10)  1   Current Weight (kg) (kg) 60.6   Height (cm) centimeters 165.1   BSA (m2) 1.6   Physical Exam:  General/Skin/HEENT:   General normal    Skin normal    Eyes normal    ENMT normal    Head and Neck normal    Additional PE Well-developed well-nourished slightly thin female in NAD. She status post wide local excision of the right breast and sentinel node biopsy. Both incisions are healing well. No dominant mass or nodularity is noted  in either breast into position examined. Lungs are clear to A&P cardiac examination shows regular rate and rhythm.   Breasts/Resp/CV/GI/GU:   Respiratory and Thorax normal    Cardiovascular normal    Gastrointestinal normal    Genitourinary normal   MS/Neuro/Psych/Lymph:   Musculoskeletal normal    Neurological normal    Lymphatics normal   Other Results:  Radiology Results: [place]:    08-Apr-13 10:15, US Breast Right   US Breast Right    REASON FOR EXAM:    RT BRST MASS 11-12 OCLOCK AND YRLY  COMMENTS:       PROCEDURE: [place]  - US BREAST RIGHT  - Oct 14 2011 10:15AM     RESULT: COMPARISON:  03/21/2011, 08/16/2008, 12/26/2005    TECHNIQUE: Digital diagnostic mammograms were obtained. FDA approved   computer-aided detection (CAD) for mammography was utilized for this   study.    Real-time sonography of the right breast was performed at the 11-12   o'clock position which is the site of clinical concern.    FINDING:    Bilateral breasts demonstrate a scattered fibroglandular density. At   approximately the 11:00 position there is a spiculated nodule. There are   no microcalcifications.    Real-time sonography of the right breast at the 11-12 o'clock position   was performed. At the 11-12:00 position there is a 1.6 x 1.6 x 1.2 cm   heterogeneously hypoechoic mass. There is mild posterior acoustic   enhancement. There is internal Doppler flow.    IMPRESSION:     1.     There is a heterogeneously hypoechoic 1.6 cm mass in the right   breast at the 11-12:00 position. Tissue diagnosis recommended.    BI-RADS:  Category 4 - Suspicious Abnormality  A negative mammogram report does not preclude biopsy or other evaluation   of a clinically palpable or otherwise suspicious mass or lesion.Breast   cancer may not be detected by mammography in up to 10% of cases.    Thank you for the opportunity to contribute to the care of your patient.    ADDENDUM: 10-23-2011    Corrected comparison studies above are 09-07-2010, 07-17-2009, and 11-12-2007.          Verified By: Jennette Banker, M.D., MD  [facility]:    08-Apr-13 09:22, Digital Diagnostic Mammogram Bilateral   Digital Diagnostic Mammogram Bilateral    REASON FOR EXAM:    RT BRST MASS 11-12 OCLOCK AND YRLY  COMMENTS:       PROCEDURE: MAM - MAM DGTL DIAGNOSTIC MAMMO W/CAD  - Oct 14 2011  9:22AM     RESULT: COMPARISON:  03/21/2011, 08/16/2008, 12/26/2005    TECHNIQUE: Digital diagnostic mammograms were obtained. FDA approved   computer-aided detection (CAD) for mammography was utilized for  this   study.    Real-time sonography of the right breast was performed at the 11-12 clock   position which is the site of clinical concern.    FINDING:    Bilateral breasts demonstrate a scattered fibroglandular density. At   approximately the 11:00 position there is a spiculated nodule. There are   no microcalcifications.    Real-time sonography of the right breast at the 11-12 o'clock position   was performed. At the 11-12:00 position there is a 1.6 x 1.6 x 1.2 cm   heterogeneously hypoechoic mass. There is mild posterior acoustic   enhancement. There is internal Doppler flow.    IMPRESSION:  1.     There is a heterogeneously hypoechoic 1.6 cm mass inthe right   breast at the 11-12:00 position. Tissue diagnosis recommended.    BI-RADS:  Category 4 - Suspicious Abnormality  A negative mammogram report does not preclude biopsy or other evaluation   of a clinically palpable or otherwise suspicious mass or lesion. Breast   cancer may not be detected by mammography in up to 10% of cases.    Thank you for the opportunity to contribute to the care of your patient.    ADDENDUM: 10-23-2011    Corrected comparison studies above are 09-07-2010, 07-17-2009, and 11-12-2007.          Verified By: Jennette Banker, M.D., MD   Assessment and Plan:  Impression:   pathologic stage I ER/PR positive invasive mammary carcinoma status post wide local excision and sentinel node biopsy in 49 year old female  Plan:   at this time I discussed the case personally with Dr. Oliva Bustard. Patient's breasts are too small and her age make her not an ideal candidate for accelerated partial breast irradiation. Would recommend whole breast radiation. Would treat her breast to 5000 cGy and boost or scar another 2000 cGy based on the close margin of 2 mm. Would use dose recommendations based on [place] guidelines. Patient will also undergo Oncotype DX testing to see if she is a candidate for her recurrence risk to guide  oncology and their recommendations as far as chemotherapy is concerned. We will await final test report before going ahead with simulation. Patient will be referred back to me when Oncotype DX testing has been completed. Risks and benefits of treatment including increased skin reaction, fatigue and possible slight inclusion of superficial lung in her treatment field is all explained in detail to her patient.  I would like to take this opportunity to thank you for allowing me to continue to participate in this patient's care.  CC Referral:   cc: Dr. Hervey Ard   Electronic Signatures: Baruch Gouty Roda Shutters (MD)  (Signed 09-May-13 14:19)  Authored: HPI, Diagnosis, Past Hx, PFSH, Allergies, Home Meds, ROS, Nursing Notes, Physical Exam, Other Results, Encounter Assessment and Plan, CC Referring Physician   Last Updated: 09-May-13 14:19 by Armstead Peaks (MD)

## 2014-11-02 ENCOUNTER — Ambulatory Visit: Payer: Self-pay | Admitting: General Surgery

## 2014-11-07 ENCOUNTER — Other Ambulatory Visit: Payer: Self-pay | Admitting: *Deleted

## 2014-11-07 MED ORDER — EXEMESTANE 25 MG PO TABS: 25.0000 mg | ORAL_TABLET | Freq: Every day | ORAL | Status: DC

## 2014-11-09 ENCOUNTER — Encounter: Payer: Self-pay | Admitting: General Surgery

## 2014-11-16 ENCOUNTER — Ambulatory Visit (INDEPENDENT_AMBULATORY_CARE_PROVIDER_SITE_OTHER): Payer: BLUE CROSS/BLUE SHIELD | Admitting: General Surgery

## 2014-11-16 ENCOUNTER — Encounter: Payer: Self-pay | Admitting: General Surgery

## 2014-11-16 VITALS — BP 118/78 | HR 76 | Resp 12 | Ht 65.0 in | Wt 137.0 lb

## 2014-11-16 DIAGNOSIS — Z853 Personal history of malignant neoplasm of breast: Secondary | ICD-10-CM

## 2014-11-16 NOTE — Patient Instructions (Signed)
Continue self breast exams. Call office for any new breast issues or concerns. 

## 2014-11-16 NOTE — Progress Notes (Signed)
Patient ID: Cynthia Willis, female   DOB: 1966-04-23, 49 y.o.   MRN: [id number]  Chief Complaint  Patient presents with  . Follow-up    mammogram    HPI Cynthia Willis is a 49 y.o. female.  who presents for her follow up breast cancer and breast evaluation. The most recent mammogram was done on 11-08-14.  Patient does perform regular self breast checks and gets regular mammograms done. No new breast issues.   She is now on Aromasin since her oophorectomy last year.  HPI  Past Medical History  Diagnosis Date  . Personal history of malignant neoplasm of breast 2013     right  . Cystitis   . Cancer 2013    November 05, 2011 wide excision and sentinel node biopsy for invasive ductal carcinoma involving the right breast.  . Malignant neoplasm of upper-outer quadrant of female breast November 05, 2011    T1C, N0, M0; ER 90%, PR 90%, HER-2/neu not over expressed. Oncotype DX: low risk of recurrence with antiestrogen alone at 8%.    Past Surgical History  Procedure Laterality Date  . Colonoscopy  2013    hyperplastic polyp identified in the sigmoid  04/21/2012  , Dr. Donnella Willis  . Lasik  2013  . Dilation and curettage of uterus  2001  . Oophorectomy  Nov 09 2013    Dr. Barnett Willis  . Breast surgery Right 2013    Wide excision, mastoplasty, sentinel node biopsy.    Family History  Problem Relation Age of Onset  . Colon cancer Father   . Cancer Maternal Aunt     breast    Social History History  Substance Use Topics  . Smoking status: Never Smoker   . Smokeless tobacco: Never Used  . Alcohol Use: No    No Known Allergies  Current Outpatient Prescriptions  Medication Sig Dispense Refill  . aspirin 81 MG tablet Take 81 mg by mouth daily.    . calcium carbonate (OS-CAL) 600 MG TABS tablet Take 600 mg by mouth 2 (two) times daily with a meal.    . Cholecalciferol (VITAMIN D-3) 5000 UNITS TABS Take by mouth.    [name] exemestane (AROMASIN) 25 MG tablet Take 1 tablet (25 mg total) by mouth  daily. 90 tablet 3   No current facility-administered medications for this visit.    Review of Systems Review of Systems  Constitutional: Negative.   Respiratory: Negative.   Cardiovascular: Negative.     Blood pressure 118/78, pulse 76, resp. rate 12, height '5\' 5"'  (1.651 m), weight 137 lb (62.143 kg).  Physical Exam Physical Exam  Constitutional: She is oriented to person, place, and time. She appears well-developed and well-nourished.  Neck: Neck supple.  Cardiovascular: Normal rate, regular rhythm and normal heart sounds.   Pulmonary/Chest: Effort normal and breath sounds normal. Right breast exhibits no inverted nipple, no mass, no nipple discharge, no skin change and no tenderness. Left breast exhibits no inverted nipple, no mass, no nipple discharge, no skin change and no tenderness.    Well healed incision right upper outer quadrant right breast  Lymphadenopathy:    She has no cervical adenopathy.    She has no axillary adenopathy.  Neurological: She is alert and oriented to person, place, and time.  Skin: Skin is warm and dry.    Data Reviewed Bilateral diagnostic mammograms dated 11/08/2014 completed at [facility] were reviewed. Post lumpectomy changes identified. BI-RADS-2.  Assessment    Benign exam now 2  years status post excision and sentinel node biopsy. Good tolerance of antiestrogen therapy.    Plan      The patient has been asked to return to the office in one year with a bilateral diagnostic mammogram.    PCP:  No Pcp Per Patient Dr. Zenia Willis, Forest Gleason 11/18/2014, 5:36 PM

## 2014-11-18 ENCOUNTER — Encounter: Payer: Self-pay | Admitting: General Surgery

## 2014-12-07 ENCOUNTER — Ambulatory Visit: Payer: Self-pay | Admitting: Oncology

## 2014-12-14 ENCOUNTER — Encounter: Payer: Self-pay | Admitting: Oncology

## 2014-12-28 ENCOUNTER — Encounter: Payer: Self-pay | Admitting: Oncology

## 2014-12-28 ENCOUNTER — Inpatient Hospital Stay: Payer: BLUE CROSS/BLUE SHIELD | Attending: Oncology | Admitting: Oncology

## 2014-12-28 VITALS — BP 122/86 | HR 73 | Temp 98.3°F | Wt 139.3 lb

## 2014-12-28 DIAGNOSIS — Z780 Asymptomatic menopausal state: Secondary | ICD-10-CM

## 2014-12-28 DIAGNOSIS — Z7982 Long term (current) use of aspirin: Secondary | ICD-10-CM

## 2014-12-28 DIAGNOSIS — Z90722 Acquired absence of ovaries, bilateral: Secondary | ICD-10-CM

## 2014-12-28 DIAGNOSIS — Z79899 Other long term (current) drug therapy: Secondary | ICD-10-CM

## 2014-12-28 DIAGNOSIS — C50919 Malignant neoplasm of unspecified site of unspecified female breast: Secondary | ICD-10-CM | POA: Insufficient documentation

## 2014-12-28 DIAGNOSIS — Z800 Family history of malignant neoplasm of digestive organs: Secondary | ICD-10-CM

## 2014-12-28 DIAGNOSIS — C50911 Malignant neoplasm of unspecified site of right female breast: Secondary | ICD-10-CM

## 2014-12-28 DIAGNOSIS — Z79811 Long term (current) use of aromatase inhibitors: Secondary | ICD-10-CM

## 2014-12-28 DIAGNOSIS — Z170 Estrogen receptor positive status [ER+]: Secondary | ICD-10-CM | POA: Diagnosis not present

## 2014-12-28 DIAGNOSIS — Z905 Acquired absence of kidney: Secondary | ICD-10-CM

## 2014-12-28 DIAGNOSIS — C50411 Malignant neoplasm of upper-outer quadrant of right female breast: Secondary | ICD-10-CM

## 2014-12-28 DIAGNOSIS — Z803 Family history of malignant neoplasm of breast: Secondary | ICD-10-CM

## 2014-12-28 HISTORY — DX: Malignant neoplasm of unspecified site of right female breast: C50.911

## 2014-12-28 NOTE — Progress Notes (Signed)
Patient does not have living will.  Never smoked. 

## 2014-12-28 NOTE — Progress Notes (Signed)
[place] @ [facility] [phone]  Fax:([phone]) [place]: 1965/12/22  MR#: [id number]  MGQ#:[id number]  Patient Care Team: No Pcp Per Patient as PCP - General (General Practice) Robert Bellow, MD as Consulting Physician (General Surgery)  CHIEF COMPLAINT:  Chief Complaint  Patient presents with  . Follow-up    Oncology History   Chief Complaint/Diagnosis:   56. 49 year old female status was wide local excision and sentinel node biopsy for a clinical stage I (T1 C. N0 M0) ER/PR positive HER-2/neu negative invasive mammary carcinoma.PATIENT HAS    FISH RATIO OF 1.67 status post lumpectomy(April, 2013) Oncotype DX.  Lowest risk.  Score of 12 with 8-10% risk over   10 years for recurrent disease 2. Started on tamoxifen from August of 2013. 3. BRCA1 and BRCA2 no mutation identified (May of 2013) 4.Started on Lupron from December 23, 2012 tamoxifen was discontinued been patient was started aromasin 5.bilateral oophorectomy was done in May of 2015 Patient is in postmenopausal status and has been started on Aromasin     Breast cancer   10/27/2012 Initial Diagnosis Breast cancer    No flowsheet data found.  INTERVAL HISTORY:  49 year old lady with a history of carcinoma breast 8 [place] with low Oncotype DX score presently on Aromasin and tolerating treatment very well.  Without any significant bony pains.  Taking calcium and vitamin D.  Had a mammogram done 2 months ago and was evaluated by Dr. Neale Burly.   REVIEW OF SYSTEMS:    As per HPI. Otherwise, a complete review of systems is negatve.  PAST MEDICAL HISTORY: Past Medical History  Diagnosis Date  . Personal history of malignant neoplasm of breast 2013     right  . Cystitis   . Cancer 2013    November 05, 2011 wide excision and sentinel node biopsy for invasive ductal carcinoma involving the right breast.  . Malignant neoplasm of upper-outer quadrant of female breast November 05, 2011    T1C, N0, M0;  ER 90%, PR 90%, HER-2/neu not over expressed. Oncotype DX: low risk of recurrence with antiestrogen alone at 8%.    PAST SURGICAL HISTORY: Past Surgical History  Procedure Laterality Date  . Colonoscopy  2013    hyperplastic polyp identified in the sigmoid  04/21/2012  , Dr. [name]  . Lasik  2013  . Dilation and curettage of uterus  2001  . Oophorectomy  Nov 09 2013    Dr. [name]  . Breast surgery Right 2013    Wide excision, mastoplasty, sentinel node biopsy.  Significant History/PMH:   Anxiety: r/t breast cancer dx   Right Breast Cancer: Apr 2013   Dilation and Curretage:   Preventive Screening:  Has patient had any of the following test? Colonscopy  Mammography  Pap Smear   Last Colonoscopy: Never   Last Mammography: April 2013   Last Pap Smear: March 2013   Smoking History: Smoking History Never Smoked.  PFSH: Comments: No family history of colorectal cancer, breast cancer, or ovarian cancer.  Comments: Does not smoke does not drink  Additional Past Medical and Surgical History: No significant past medical history  patient had a bilateral nephrectomy    FAMILY HISTORY Family History  Problem Relation Age of Onset  . Colon cancer Father   . Cancer Maternal Aunt     breast    ADVANCED DIRECTIVES:  No flowsheet data found.  HEALTH MAINTENANCE: History  Substance Use Topics  . Smoking status: Never  Smoker   . Smokeless tobacco: Never Used  . Alcohol Use: No      No Known Allergies  Current Outpatient Prescriptions  Medication Sig Dispense Refill  . aspirin 81 MG tablet Take 81 mg by mouth daily.    . calcium carbonate (OS-CAL) 600 MG TABS tablet Take 600 mg by mouth 2 (two) times daily with a meal.    . Cholecalciferol (VITAMIN D-3) 5000 UNITS TABS Take by mouth.    [name] exemestane (AROMASIN) 25 MG tablet Take 1 tablet (25 mg total) by mouth daily. 90 tablet 3   No current facility-administered medications for this visit.     OBJECTIVE:  Filed Vitals:   12/28/14 0844  BP: 122/86  Pulse: 73  Temp: 98.3 F (36.8 C)     Body mass index is 23.19 kg/(m^2).    ECOG FS:0 - Asymptomatic  PHYSICAL EXAM: Gen. status: Slightly apprehensive but not any acute distress HEENT: No abnormality detected Examination of both breasts.  Within normal limit right breast previous lumpectomy radiation therapy no evidence of recurrent disease Abdominal exam revealed normal bowel sounds. The abdomen was soft, non-tender, and without masses, organomegaly, or appreciable enlargement of the abdominal aorta. Examination of the chest was unremarkable. There were no bony deformities, no asymmetry, and no other abnormalities. Cardiac exam revealed the PMI to be normally situated and sized. The rhythm was regular and no extrasystoles were noted during several minutes of auscultation. The first and second heart sounds were normal and physiologic splitting of the second heart sound was noted. There were no murmurs, rubs, clicks, or gallops. Examination of the skin revealed no evidence of significant rashes, suspicious appearing nevi or other concerning lesions. Lower extremity no swelling Musculoskeletal system no bony pain Neurologically, the patient was awake, alert, and oriented to person, place and time. There were no obvious focal neurologic abnormalities.   LAB RESULTS: Lab data from outside institution dated December 14, 2014: CA 27.291 4.9.  CBC is within normal limit with hemoglobin 13.0.  Glucose 106.  Estradiol is less than 5.0.  FSH 63.2.  All other lab data has been reviewed and within normal limit    STUDIES: No results found.  ASSESSMENT: 49 year old lady with history of carcinoma of right breast stage I C estrogen progesterone receptor positive HER-2/neu receptor negative.  Low Oncotype DX score for last 3 years patient is on anti-hormonal therapy now on Aromasin.  Patient had bilateral nephrectomy and patient is in  postmenopausal status.   MEDICAL DECISION MAKING:  Lab data has been reviewed Continue Aromasin During next year examination order bone density study Patient had a colonoscopy done 2 years ago and reported to be negative  Patient expressed understanding and was in agreement with this plan. She also understands that She can call clinic at any time with any questions, concerns, or complaints.    No matching staging information was found for the patient.  Forest Gleason, MD   12/28/2014 9:04 AM

## 2015-06-20 ENCOUNTER — Other Ambulatory Visit: Payer: Self-pay | Admitting: Oncology

## 2015-06-21 LAB — CBC WITH DIFFERENTIAL/PLATELET
Basophils Absolute: 0 10*3/uL (ref 0.0–0.2)
Basos: 1 %
EOS (ABSOLUTE): 0.4 10*3/uL (ref 0.0–0.4)
EOS: 5 %
HEMATOCRIT: 37.7 % (ref 34.0–46.6)
HEMOGLOBIN: 13 g/dL (ref 11.1–15.9)
Immature Grans (Abs): 0 10*3/uL (ref 0.0–0.1)
Immature Granulocytes: 0 %
LYMPHS ABS: 3.2 10*3/uL — AB (ref 0.7–3.1)
Lymphs: 47 %
MCH: 30.8 pg (ref 26.6–33.0)
MCHC: 34.5 g/dL (ref 31.5–35.7)
MCV: 89 fL (ref 79–97)
MONOCYTES: 10 %
Monocytes Absolute: 0.7 10*3/uL (ref 0.1–0.9)
NEUTROS ABS: 2.6 10*3/uL (ref 1.4–7.0)
Neutrophils: 37 %
Platelets: 276 10*3/uL (ref 150–379)
RBC: 4.22 x10E6/uL (ref 3.77–5.28)
RDW: 12.8 % (ref 12.3–15.4)
WBC: 7 10*3/uL (ref 3.4–10.8)

## 2015-06-21 LAB — COMPREHENSIVE METABOLIC PANEL
A/G RATIO: 1.5 (ref 1.1–2.5)
ALBUMIN: 4.1 g/dL (ref 3.5–5.5)
ALT: 19 IU/L (ref 0–32)
AST: 25 IU/L (ref 0–40)
Alkaline Phosphatase: 106 IU/L (ref 39–117)
BUN/Creatinine Ratio: 20 (ref 9–23)
BUN: 13 mg/dL (ref 6–24)
CHLORIDE: 100 mmol/L (ref 96–106)
CO2: 26 mmol/L (ref 18–29)
Calcium: 9.6 mg/dL (ref 8.7–10.2)
Creatinine, Ser: 0.66 mg/dL (ref 0.57–1.00)
GFR calc Af Amer: 120 mL/min/{1.73_m2} (ref >59)
GFR, EST NON AFRICAN AMERICAN: 104 mL/min/{1.73_m2} (ref >59)
Globulin, Total: 2.7 g/dL (ref 1.5–4.5)
Glucose: 94 mg/dL (ref 65–99)
POTASSIUM: 4.1 mmol/L (ref 3.5–5.2)
Sodium: 140 mmol/L (ref 134–144)
TOTAL PROTEIN: 6.8 g/dL (ref 6.0–8.5)

## 2015-06-21 LAB — CANCER ANTIGEN 27.29: CA 27.29: 7.1 U/mL (ref 0.0–38.6)

## 2015-06-28 ENCOUNTER — Inpatient Hospital Stay: Payer: BLUE CROSS/BLUE SHIELD | Attending: Oncology | Admitting: Oncology

## 2015-06-28 ENCOUNTER — Encounter: Payer: Self-pay | Admitting: Oncology

## 2015-06-28 VITALS — BP 114/75 | HR 69 | Temp 97.5°F | Wt 138.9 lb

## 2015-06-28 DIAGNOSIS — Z170 Estrogen receptor positive status [ER+]: Secondary | ICD-10-CM | POA: Diagnosis not present

## 2015-06-28 DIAGNOSIS — Z90722 Acquired absence of ovaries, bilateral: Secondary | ICD-10-CM | POA: Insufficient documentation

## 2015-06-28 DIAGNOSIS — Z780 Asymptomatic menopausal state: Secondary | ICD-10-CM | POA: Insufficient documentation

## 2015-06-28 DIAGNOSIS — Z803 Family history of malignant neoplasm of breast: Secondary | ICD-10-CM | POA: Diagnosis not present

## 2015-06-28 DIAGNOSIS — Z853 Personal history of malignant neoplasm of breast: Secondary | ICD-10-CM | POA: Diagnosis not present

## 2015-06-28 DIAGNOSIS — Z905 Acquired absence of kidney: Secondary | ICD-10-CM | POA: Diagnosis not present

## 2015-06-28 DIAGNOSIS — Z7982 Long term (current) use of aspirin: Secondary | ICD-10-CM

## 2015-06-28 DIAGNOSIS — Z79899 Other long term (current) drug therapy: Secondary | ICD-10-CM | POA: Diagnosis not present

## 2015-06-28 DIAGNOSIS — Z923 Personal history of irradiation: Secondary | ICD-10-CM

## 2015-06-28 DIAGNOSIS — F419 Anxiety disorder, unspecified: Secondary | ICD-10-CM | POA: Diagnosis not present

## 2015-06-28 DIAGNOSIS — C50911 Malignant neoplasm of unspecified site of right female breast: Secondary | ICD-10-CM

## 2015-06-28 DIAGNOSIS — Z800 Family history of malignant neoplasm of digestive organs: Secondary | ICD-10-CM | POA: Diagnosis not present

## 2015-06-28 DIAGNOSIS — Z79811 Long term (current) use of aromatase inhibitors: Secondary | ICD-10-CM

## 2015-06-28 DIAGNOSIS — C50411 Malignant neoplasm of upper-outer quadrant of right female breast: Secondary | ICD-10-CM

## 2015-06-28 NOTE — Progress Notes (Signed)
[place] @ [facility] Telephone:(336) [phone]  Fax:(336) [place]: 29-Jan-1966  MR#: [id number]  CWC#:[id number]  Patient Care Team: No Pcp Per Patient as PCP - General (General Practice) Robert Bellow, MD as Consulting Physician (General Surgery)  CHIEF COMPLAINT:  No chief complaint on file.   Oncology History   Chief Complaint/Diagnosis:   79. 49 year old female status was wide local excision and sentinel node biopsy for a clinical stage I (T1 C. N0 M0) ER/PR positive HER-2/neu negative invasive mammary carcinoma.PATIENT HAS    FISH RATIO OF 1.67 status post lumpectomy(April, 2013) Oncotype DX.  Lowest risk.  Score of 12 with 8-10% risk over   10 years for recurrent disease 2. Started on tamoxifen from August of 2013. 3. BRCA1 and BRCA2 no mutation identified (May of 2013) 4.Started on Lupron from December 23, 2012 tamoxifen was discontinued been patient was started aromasin 5.bilateral oophorectomy was done in May of 2015 Patient is in postmenopausal status and has been started on Aromasin     Breast cancer ([place])   10/27/2012 Initial Diagnosis Breast cancer    No flowsheet data found.  INTERVAL HISTORY:  49 year old lady with a history of carcinoma breast 8 [place] with low Oncotype DX score presently on Aromasin and tolerating treatment very well.  Without any significant bony pains.  Taking calcium and vitamin D.  Had a mammogram done 2 months ago and was evaluated by Dr. Neale Burly.  Patient will have another bone density study. She has joint pain   And hot flashes.  REVIEW OF SYSTEMS:   GENERAL:  Feels good.  Active.  No fevers, sweats or weight loss. PERFORMANCE STATUS (ECOG): 0 HEENT:  No visual changes, runny nose, sore throat, mouth sores or tenderness. Lungs: No shortness of breath or cough.  No hemoptysis. Cardiac:  No chest pain, palpitations, orthopnea, or PND. GI:  No nausea, vomiting, diarrhea, constipation, melena or hematochezia. GU:  No  urgency, frequency, dysuria, or hematuria. Musculoskeletal:  No back pain.  No joint pain.  No muscle tenderness. Extremities:  No pain or swelling. Skin:  No rashes or skin changes. Neuro:  No headache, numbness or weakness, balance or coordination issues. Endocrine:  No diabetes, thyroid issues, hot flashes or night sweats. Psych:  No mood changes, depression or anxiety. Pain:  No focal pain. Review of systems:  All other systems reviewed and found to be negative. As per HPI. Otherwise, a complete review of systems is negatve.  PAST MEDICAL HISTORY: Past Medical History  Diagnosis Date  . Personal history of malignant neoplasm of breast 2013     right  . Cystitis   . Cancer [facility]) 2013    November 05, 2011 wide excision and sentinel node biopsy for invasive ductal carcinoma involving the right breast.  . Malignant neoplasm of upper-outer quadrant of female breast ([place]) November 05, 2011    T1C, N0, M0; ER 90%, PR 90%, HER-2/neu not over expressed. Oncotype DX: low risk of recurrence with antiestrogen alone at 8%.  . Carcinoma of right breast ([place]) 12/28/2014    PAST SURGICAL HISTORY: Past Surgical History  Procedure Laterality Date  . Colonoscopy  2013    hyperplastic polyp identified in the sigmoid  04/21/2012  , Dr. [name]  . Lasik  2013  . Dilation and curettage of uterus  2001  . Oophorectomy  Nov 09 2013    Dr. [name]  . Breast surgery Right 2013    Wide excision, mastoplasty,  sentinel node biopsy.  Significant History/PMH:   Anxiety: r/t breast cancer dx   Right Breast Cancer: Apr 2013   Dilation and Curretage:   Preventive Screening:  Has patient had any of the following test? Colonscopy  Mammography  Pap Smear   Last Colonoscopy: Never   Last Mammography: April 2013   Last Pap Smear: March 2013   Smoking History: Smoking History Never Smoked.  PFSH: Comments: No family history of colorectal cancer, breast cancer, or ovarian cancer.  Comments: Does not  smoke does not drink  Additional Past Medical and Surgical History: No significant past medical history  patient had a bilateral nephrectomy    FAMILY HISTORY Family History  Problem Relation Age of Onset  . Colon cancer Father   . Cancer Maternal Aunt     breast    ADVANCED DIRECTIVES:  No flowsheet data found.  HEALTH MAINTENANCE: Social History  Substance Use Topics  . Smoking status: Never Smoker   . Smokeless tobacco: Never Used  . Alcohol Use: No      No Known Allergies  Current Outpatient Prescriptions  Medication Sig Dispense Refill  . aspirin 81 MG tablet Take 81 mg by mouth daily.    . calcium carbonate (OS-CAL) 600 MG TABS tablet Take 600 mg by mouth 2 (two) times daily with a meal.    . Cholecalciferol (VITAMIN D-3) 5000 UNITS TABS Take by mouth.    [name] exemestane (AROMASIN) 25 MG tablet Take 1 tablet (25 mg total) by mouth daily. 90 tablet 3   No current facility-administered medications for this visit.    OBJECTIVE:  Filed Vitals:   06/28/15 0824  BP: 114/75  Pulse: 69  Temp: 97.5 F (36.4 C)     Body mass index is 23.11 kg/(m^2).    ECOG FS:0 - Asymptomatic  PHYSICAL EXAM: Gen. status: Slightly apprehensive but not any acute distress HEENT: No abnormality detected Examination of both breasts.  Within normal limit right breast previous lumpectomy radiation therapy no evidence of recurrent disease Abdominal exam revealed normal bowel sounds. The abdomen was soft, non-tender, and without masses, organomegaly, or appreciable enlargement of the abdominal aorta. Examination of the chest was unremarkable. There were no bony deformities, no asymmetry, and no other abnormalities. Cardiac exam revealed the PMI to be normally situated and sized. The rhythm was regular and no extrasystoles were noted during several minutes of auscultation. The first and second heart sounds were normal and physiologic splitting of the second heart sound was noted. There were no  murmurs, rubs, clicks, or gallops. Examination of the skin revealed no evidence of significant rashes, suspicious appearing nevi or other concerning lesions. Lower extremity no swelling Musculoskeletal system no bony pain Neurologically, the patient was awake, alert, and oriented to person, place and time. There were no obvious focal neurologic abnormalities.   LAB RESULTS: Lab data from outside institution dated December 14, 2014: CA 27.291 4.9.  CBC is within normal limit with hemoglobin 13.0.  Glucose 106.  Estradiol is less than 5.0.  FSH 63.2.  All other lab data has been reviewed and within normal limit    STUDIES: Mammogram was done as outside institution in May of 2016 HEENT followed by surgeon ASSESSMENT: 49 year old lady with history of carcinoma of right breast stage I C estrogen progesterone receptor positive HER-2/neu receptor negative.  Low Oncotype DX score for last 3 years patient is on anti-hormonal therapy now on Aromasin.  Patient had bilateral nephrectomy and patient is in postmenopausal status.  MEDICAL DECISION MAKING:  Lab data has been reviewed Continue Aromasin Reevaluation in 6 month with a repeat bone density study  Patient expressed understanding and was in agreement with this plan. She also understands that She can call clinic at any time with any questions, concerns, or complaints.    No matching staging information was found for the patient.  Forest Gleason, MD   06/28/2015 8:33 AM

## 2015-08-24 ENCOUNTER — Encounter: Payer: Self-pay | Admitting: *Deleted

## 2015-08-28 ENCOUNTER — Ambulatory Visit: Payer: BLUE CROSS/BLUE SHIELD

## 2015-10-25 ENCOUNTER — Other Ambulatory Visit: Payer: Self-pay | Admitting: Oncology

## 2015-11-13 ENCOUNTER — Telehealth: Payer: Self-pay | Admitting: Oncology

## 2015-11-13 NOTE — Telephone Encounter (Signed)
Patient had lab visit scheduled for 12/21/15 but she prefers to do this through [facility] as she can get it done for free through insurance. She said she needs to get the forms from you and asked if you could please prepare them and leave them with the registration ladies or [place] and to please give her a call when it's ready for her. Thanks!

## 2015-11-13 NOTE — Telephone Encounter (Signed)
Informed pt that Cynthia Willis will have forms waiting for her by tomorrow end of day

## 2015-11-21 ENCOUNTER — Encounter: Payer: Self-pay | Admitting: General Surgery

## 2015-11-27 ENCOUNTER — Ambulatory Visit (INDEPENDENT_AMBULATORY_CARE_PROVIDER_SITE_OTHER): Payer: BLUE CROSS/BLUE SHIELD | Admitting: General Surgery

## 2015-11-27 VITALS — BP 118/74 | HR 74 | Resp 12 | Ht 64.0 in | Wt 136.0 lb

## 2015-11-27 DIAGNOSIS — Z853 Personal history of malignant neoplasm of breast: Secondary | ICD-10-CM

## 2015-11-27 NOTE — Progress Notes (Signed)
Patient ID: Cynthia Willis, female   DOB: 1965/08/14, 50 y.o.   MRN: [id number]  Chief Complaint  Patient presents with  . Follow-up    mammogram    HPI Cynthia Willis is a 50 y.o. female who presents for a breast evaluation. The most recent mammogram was done on 11/21/15.  Patient does perform regular self breast checks and gets regular mammograms done.    HPI  Past Medical History  Diagnosis Date  . Personal history of malignant neoplasm of breast 2013     right  . Cystitis   . Cancer [facility]) 2013    November 05, 2011 wide excision and sentinel node biopsy for invasive ductal carcinoma involving the right breast.  . Malignant neoplasm of upper-outer quadrant of female breast ([place]) November 05, 2011    T1C, N0, M0; ER 90%, PR 90%, HER-2/neu not over expressed. Oncotype DX: low risk of recurrence with antiestrogen alone at 8%.  . Carcinoma of right breast ([place]) 12/28/2014    Past Surgical History  Procedure Laterality Date  . Colonoscopy  2013    hyperplastic polyp identified in the sigmoid  04/21/2012  , Dr. [name]  . Lasik  2013  . Dilation and curettage of uterus  2001  . Oophorectomy  Nov 09 2013    Dr. [name]  . Breast surgery Right 2013    Wide excision, mastoplasty, sentinel node biopsy.    Family History  Problem Relation Age of Onset  . Colon cancer Father   . Cancer Maternal Aunt     breast    Social History Social History  Substance Use Topics  . Smoking status: Never Smoker   . Smokeless tobacco: Never Used  . Alcohol Use: No    No Known Allergies  Current Outpatient Prescriptions  Medication Sig Dispense Refill  . aspirin 81 MG tablet Take 81 mg by mouth daily.    . calcium carbonate (OS-CAL) 600 MG TABS tablet Take 600 mg by mouth 2 (two) times daily with a meal.    . Cholecalciferol (VITAMIN D-3) 5000 UNITS TABS Take by mouth.    [name] exemestane (AROMASIN) 25 MG tablet TAKE 1 TABLET BY MOUTH DAILY 90 tablet 3   No current facility-administered  medications for this visit.    Review of Systems Review of Systems  Constitutional: Negative.   Respiratory: Negative.   Cardiovascular: Negative.     Blood pressure 118/74, pulse 74, resp. rate 12, height '5\' 4"'  (1.626 m), weight 136 lb (61.689 kg).  Physical Exam Physical Exam  Constitutional: She is oriented to person, place, and time. She appears well-developed and well-nourished.  Eyes: Conjunctivae are normal. No scleral icterus.  Neck: Neck supple.  Cardiovascular: Normal rate, regular rhythm and normal heart sounds.   Pulmonary/Chest: Effort normal and breath sounds normal. Right breast exhibits tenderness (mild). Right breast exhibits no inverted nipple, no mass, no nipple discharge and no skin change. Left breast exhibits no inverted nipple, no mass, no nipple discharge, no skin change and no tenderness.    Right breast well healed incision from 11 to 1 o'clock.   Abdominal: Soft. Normal appearance and bowel sounds are normal. There is no hepatomegaly. There is no tenderness. No hernia.  Lymphadenopathy:    She has no cervical adenopathy.    She has no axillary adenopathy.  Neurological: She is alert and oriented to person, place, and time.  Skin: Skin is warm and dry.    Data Reviewed Bilateral mammograms completed at  [facility] dated 11/21/2015 were reviewed. Dense breasts. Postsurgical changes. BI-RADS-2.  Assessment    Benign breast exam now 3 years status post breast conservation.    Plan    The patient will continue on her Aromasin. She will likely be a candidate for Breast Cancer Index testing at the end of that time to determine if extended antiestrogen therapy is appropriate. This will be coordinated with Dr. Metro Kung office.   The patient has been asked to return to the office in one year with a bilateral diagnostic mammogram.   PCP:  No Pcp  This information has been scribed by Gaspar Cola CMA.      Robert Bellow 11/28/2015, 12:46  PM

## 2015-11-27 NOTE — Patient Instructions (Signed)
The patient has been asked to return to the office in one year with a bilateral diagnostic mammogram. 

## 2015-12-21 ENCOUNTER — Other Ambulatory Visit: Payer: BLUE CROSS/BLUE SHIELD

## 2015-12-21 ENCOUNTER — Ambulatory Visit
Admission: RE | Admit: 2015-12-21 | Discharge: 2015-12-21 | Disposition: A | Discharge status: Home / Self Care | Payer: BLUE CROSS/BLUE SHIELD | Source: Ambulatory Visit | Attending: Oncology | Admitting: Oncology

## 2015-12-21 ENCOUNTER — Encounter: Payer: Self-pay | Admitting: Family Medicine

## 2015-12-21 DIAGNOSIS — Z853 Personal history of malignant neoplasm of breast: Secondary | ICD-10-CM | POA: Insufficient documentation

## 2015-12-21 DIAGNOSIS — M8580 Other specified disorders of bone density and structure, unspecified site: Secondary | ICD-10-CM | POA: Insufficient documentation

## 2015-12-21 DIAGNOSIS — Z13820 Encounter for screening for osteoporosis: Secondary | ICD-10-CM | POA: Insufficient documentation

## 2015-12-21 DIAGNOSIS — C50911 Malignant neoplasm of unspecified site of right female breast: Secondary | ICD-10-CM

## 2015-12-28 ENCOUNTER — Encounter: Payer: Self-pay | Admitting: Family Medicine

## 2015-12-28 ENCOUNTER — Inpatient Hospital Stay: Payer: BLUE CROSS/BLUE SHIELD | Attending: Family Medicine | Admitting: Family Medicine

## 2015-12-28 VITALS — BP 107/73 | HR 74 | Temp 98.7°F | Wt 136.0 lb

## 2015-12-28 DIAGNOSIS — R232 Flushing: Secondary | ICD-10-CM | POA: Diagnosis not present

## 2015-12-28 DIAGNOSIS — C50411 Malignant neoplasm of upper-outer quadrant of right female breast: Secondary | ICD-10-CM | POA: Diagnosis present

## 2015-12-28 DIAGNOSIS — Z780 Asymptomatic menopausal state: Secondary | ICD-10-CM | POA: Diagnosis not present

## 2015-12-28 DIAGNOSIS — M8580 Other specified disorders of bone density and structure, unspecified site: Secondary | ICD-10-CM

## 2015-12-28 DIAGNOSIS — Z79899 Other long term (current) drug therapy: Secondary | ICD-10-CM | POA: Insufficient documentation

## 2015-12-28 DIAGNOSIS — Z170 Estrogen receptor positive status [ER+]: Secondary | ICD-10-CM

## 2015-12-28 DIAGNOSIS — C50911 Malignant neoplasm of unspecified site of right female breast: Secondary | ICD-10-CM

## 2015-12-28 DIAGNOSIS — Z90722 Acquired absence of ovaries, bilateral: Secondary | ICD-10-CM | POA: Diagnosis not present

## 2015-12-28 DIAGNOSIS — Z923 Personal history of irradiation: Secondary | ICD-10-CM | POA: Insufficient documentation

## 2015-12-28 DIAGNOSIS — Z803 Family history of malignant neoplasm of breast: Secondary | ICD-10-CM | POA: Insufficient documentation

## 2015-12-28 DIAGNOSIS — Z79811 Long term (current) use of aromatase inhibitors: Secondary | ICD-10-CM | POA: Insufficient documentation

## 2015-12-28 DIAGNOSIS — Z7982 Long term (current) use of aspirin: Secondary | ICD-10-CM | POA: Diagnosis not present

## 2015-12-28 DIAGNOSIS — C50919 Malignant neoplasm of unspecified site of unspecified female breast: Secondary | ICD-10-CM | POA: Insufficient documentation

## 2015-12-28 DIAGNOSIS — Z800 Family history of malignant neoplasm of digestive organs: Secondary | ICD-10-CM | POA: Insufficient documentation

## 2015-12-28 NOTE — Progress Notes (Signed)
Patient here for routine check up.  Is interested in medication for hot flashes, in interested in [place]

## 2015-12-28 NOTE — Progress Notes (Signed)
[place]  Telephone:(336) [phone]  Fax:(336) [place] DOB: Nov 18, 1965  MR#: [id number]  WVP#:[id number]  Patient Care Team: No Pcp Per Patient as PCP - General (General Practice) Robert Bellow, MD as Consulting Physician (General Surgery)  CHIEF COMPLAINT:  Chief Complaint  Patient presents with  . Carcinoma of right breast    INTERVAL HISTORY:  Patient is here for further follow-up regarding carcinoma of the right breast. Patient is currently on Aromasin, vitamin D, and calcium and tolerating very well. Patient had a DEXA scan performed last week which showed osteopenia. Patient has recently had a mammogram in May 2017 which has been scanned into our system and was BI-RADS 2, benign. She continues to follow up with Dr. Bary Castilla. Patient continues to have hot flashes which she reports are very bothersome. She has questions about trying a in bowel supplements called Relizen.  REVIEW OF SYSTEMS:   Review of Systems  Constitutional: Negative for fever, chills, weight loss, malaise/fatigue and diaphoresis.       Hot flashes  HENT: Negative.   Eyes: Negative.   Respiratory: Negative for cough, hemoptysis, sputum production, shortness of breath and wheezing.   Cardiovascular: Negative for chest pain, palpitations, orthopnea, claudication, leg swelling and PND.  Gastrointestinal: Negative for heartburn, nausea, vomiting, abdominal pain, diarrhea, constipation, blood in stool and melena.  Genitourinary: Negative.   Musculoskeletal: Negative.   Skin: Negative.   Neurological: Negative for dizziness, tingling, focal weakness, seizures and weakness.  Endo/Heme/Allergies: Does not bruise/bleed easily.  Psychiatric/Behavioral: Negative for depression. The patient is not nervous/anxious and does not have insomnia.     As per HPI. Otherwise, a complete review of systems is negatve.  ONCOLOGY HISTORY: Oncology History   Chief Complaint/Diagnosis:   42.  50 year old female status was wide local excision and sentinel node biopsy for a clinical stage I (T1 C. N0 M0) ER/PR positive HER-2/neu negative invasive mammary carcinoma.PATIENT HAS    FISH RATIO OF 1.67 status post lumpectomy(April, 2013) Oncotype DX.  Lowest risk.  Score of 12 with 8-10% risk over   10 years for recurrent disease 2. Started on tamoxifen from August of 2013. 3. BRCA1 and BRCA2 no mutation identified (May of 2013) 4.Started on Lupron from December 23, 2012 tamoxifen was discontinued been patient was started aromasin 5.bilateral oophorectomy was done in May of 2015 Patient is in postmenopausal status and has been started on Aromasin     Breast cancer ([place])   10/27/2012 Initial Diagnosis Breast cancer    PAST MEDICAL HISTORY: Past Medical History  Diagnosis Date  . Personal history of malignant neoplasm of breast 2013     right  . Cystitis   . Cancer [facility]) 2013    November 05, 2011 wide excision and sentinel node biopsy for invasive ductal carcinoma involving the right breast.  . Malignant neoplasm of upper-outer quadrant of female breast ([place]) November 05, 2011    T1C, N0, M0; ER 90%, PR 90%, HER-2/neu not over expressed. Oncotype DX: low risk of recurrence with antiestrogen alone at 8%.  . Carcinoma of right breast ([place]) 12/28/2014  . Breast cancer ([place])     PAST SURGICAL HISTORY: Past Surgical History  Procedure Laterality Date  . Colonoscopy  2013    hyperplastic polyp identified in the sigmoid  04/21/2012  , Dr. [name]  . Lasik  2013  . Dilation and curettage of uterus  2001  . Oophorectomy  Nov 09 2013    Dr.  [name]  . Breast surgery Right 2013    Wide excision, mastoplasty, sentinel node biopsy.    FAMILY HISTORY Family History  Problem Relation Age of Onset  . Colon cancer Father   . Cancer Maternal Aunt     breast    GYNECOLOGIC HISTORY:  No LMP recorded. Patient is not currently having periods (Reason: Other).     ADVANCED DIRECTIVES:     HEALTH MAINTENANCE: Social History  Substance Use Topics  . Smoking status: Never Smoker   . Smokeless tobacco: Never Used  . Alcohol Use: No     Colonoscopy:  PAP:  Bone density: 12/2015  Mammogram: 11/2015  No Known Allergies  Current Outpatient Prescriptions  Medication Sig Dispense Refill  . aspirin 81 MG tablet Take 81 mg by mouth daily.    . calcium carbonate (OS-CAL) 600 MG TABS tablet Take 600 mg by mouth 2 (two) times daily with a meal.    . Cholecalciferol (VITAMIN D-3) 5000 UNITS TABS Take by mouth.    [name] exemestane (AROMASIN) 25 MG tablet TAKE 1 TABLET BY MOUTH DAILY 90 tablet 3   No current facility-administered medications for this visit.    OBJECTIVE: BP 107/73 mmHg  Pulse 74  Temp(Src) 98.7 F (37.1 C) (Oral)  Wt 136 lb (61.689 kg)   Body mass index is 23.33 kg/(m^2).    ECOG FS:0 - Asymptomatic  General: Well-developed, well-nourished, no acute distress. Eyes: Pink conjunctiva, anicteric sclera. HEENT: Normocephalic, moist mucous membranes, clear oropharnyx. Lungs: Clear to auscultation bilaterally. Heart: Regular rate and rhythm. No rubs, murmurs, or gallops. Abdomen: Soft, nontender, nondistended. No organomegaly noted, normoactive bowel sounds. Breast: Deferred per patient request as she just saw Dr. Bary Castilla last month  Musculoskeletal: No edema, cyanosis, or clubbing. Neuro: Alert, answering all questions appropriately. Cranial nerves grossly intact. Skin: No rashes or petechiae noted. Psych: Normal affect. Lymphatics: No cervical, clavicular LAD.   LAB RESULTS: Labs performed at lab core on 12/21/2015. WBC 6.4, hemoglobin 12.1, hematocrit 37.4, platelets 259, creatinine 0.70, BUN 18, CA 27.29 9.1. All other labs within normal limits.  STUDIES: No results found.  ASSESSMENT:  Carcinoma of right breast, stage Ia, T1 cN0 M0, ER/PR positive and HER-2/neu negative.  PLAN:   1. Carcinoma of right breast. Patient is status post lumpectomy,  radiation therapy. Oncotype DX score of 12, lowest risk. She initially started on tamoxifen in August 2013, was also started on Lupron in June 2014. Patient also had a bilateral oophorectomy performed in May 2015. Patient is currently on Aromasin with calcium and vitamin D and tolerating fairly well. Clinically there is no evidence of recurrent disease. Patient's mammogram was performed in May 2017 and reported as BI-RADS 2, benign. Patient also had a DEXA scan performed in June 2017 which showed mild osteopenia. Complaints of hot flashes but patient does not want to start any kind of prescription medication. She is asking questions regarding a supplement called Relizen. I advised patient that I would contact one of our GYN oncologist to see if she has heard of this medication and get back with her.  Patient expressed understanding and was in agreement with this plan. She also understands that She can call clinic at any time with any questions, concerns, or complaints.   Dr. Rogue Bussing was available for consultation and review of plan of care for this patient.  Breast cancer ([place])   Staging form: Breast, AJCC 7th Edition     Clinical: Stage IA (T1c, N0, M0) - Unsigned  Evlyn Kanner, NP   12/28/2015 4:26 PM

## 2015-12-29 ENCOUNTER — Other Ambulatory Visit: Payer: Self-pay | Admitting: Family Medicine

## 2016-01-01 ENCOUNTER — Other Ambulatory Visit: Payer: Self-pay | Admitting: Family Medicine

## 2016-04-10 ENCOUNTER — Encounter: Payer: Self-pay | Admitting: Family

## 2016-04-10 ENCOUNTER — Ambulatory Visit (INDEPENDENT_AMBULATORY_CARE_PROVIDER_SITE_OTHER): Payer: BLUE CROSS/BLUE SHIELD | Admitting: Family

## 2016-04-10 ENCOUNTER — Ambulatory Visit (INDEPENDENT_AMBULATORY_CARE_PROVIDER_SITE_OTHER): Payer: BLUE CROSS/BLUE SHIELD

## 2016-04-10 VITALS — BP 110/60 | HR 64 | Temp 98.4°F | Ht 64.0 in | Wt 137.0 lb

## 2016-04-10 DIAGNOSIS — M5441 Lumbago with sciatica, right side: Secondary | ICD-10-CM

## 2016-04-10 DIAGNOSIS — Z800 Family history of malignant neoplasm of digestive organs: Secondary | ICD-10-CM | POA: Diagnosis not present

## 2016-04-10 DIAGNOSIS — C50911 Malignant neoplasm of unspecified site of right female breast: Secondary | ICD-10-CM

## 2016-04-10 DIAGNOSIS — M5442 Lumbago with sciatica, left side: Secondary | ICD-10-CM | POA: Diagnosis not present

## 2016-04-10 DIAGNOSIS — Z7689 Persons encountering health services in other specified circumstances: Secondary | ICD-10-CM

## 2016-04-10 DIAGNOSIS — Z0000 Encounter for general adult medical examination without abnormal findings: Secondary | ICD-10-CM | POA: Insufficient documentation

## 2016-04-10 NOTE — Assessment & Plan Note (Addendum)
Reviewed past medical social and family history. Patient will return for CPE. ordered screening labs for CPE. We discussed doing At the next visit although her last Pap was in 2015, with family history of Lynch syndrome, I advised to do this sooner.

## 2016-04-10 NOTE — Assessment & Plan Note (Addendum)
Patient has sister with Lynch syndrome which was diagnosed by dermatologist for skin lesion. Her father has a strong history of malignant polyp however no formal diagnosis of colon cancer. First degree relative with lynch syndrome, patient would meet guidelines for genetic testing. She is concerned of cost at this time, and we decided that we would discuss this further at CPE. Went ahead and placed a referral to gastroenterology as patient's last colonoscopy was 2013 and due to her family history, she likely requires more frequent surveillance.

## 2016-04-10 NOTE — Progress Notes (Signed)
Subjective:    Patient ID: Cynthia Willis, female    DOB: 03-Sep-1965, 50 y.o.   MRN: [id number]  CC: Cynthia Willis is a 50 y.o. female who presents today for follow up and to establish care.   HPI: Patient is here for follow-up on chronic disease and to establish care. She is a new patient to our clinic. Prior care had been with with GYN. Hasn't had an internist.   She has a history of breast cancer right breast, s/p lumpectomy, s/p bilateral oophorectomy. Still has uterus. Follows with oncology at [facility].   Colonoscopy 2013, hyperplastic polyp identified in the sigmoid  04/21/2012  , Dr. [name]  Sister has Lynch syndrome. Father has had malignant polpys younger than 50 yo. Normal pap smear 2015.   Complains of left buttocks, shooting pain, intermittent. Pain occasionally in right buttocks. Describes as shooting pain and has to lean to right side or walk around improves. Car rides make pain worse. No weakness, tingling in LE. No injury.           HISTORY:  Past Medical History:  Diagnosis Date  . Breast cancer ([place])   . Cancer [facility]) 2013   November 05, 2011 wide excision and sentinel node biopsy for invasive ductal carcinoma involving the right breast.  . Carcinoma of right breast ([place]) 12/28/2014  . Chicken pox   . Cystitis   . Kidney stones   . Malignant neoplasm of upper-outer quadrant of female breast ([place]) November 05, 2011   T1C, N0, M0; ER 90%, PR 90%, HER-2/neu not over expressed. Oncotype DX: low risk of recurrence with antiestrogen alone at 8%.  . Migraines   . Personal history of malignant neoplasm of breast 2013    right   Past Surgical History:  Procedure Laterality Date  . BREAST SURGERY Right 2013   Wide excision, mastoplasty, sentinel node biopsy.  . COLONOSCOPY  2013   hyperplastic polyp identified in the sigmoid  04/21/2012  , Dr. [name]  . DILATION AND CURETTAGE OF UTERUS  2001  . LASIK  2013  . OOPHORECTOMY  Nov 09 2013   Dr. [name]   Family  History  Problem Relation Age of Onset  . Colon cancer Father   . Heart disease Father     mini stroke  . Cancer Maternal Aunt     breast  . Hyperlipidemia Mother     Allergies: Review of patient's allergies indicates no known allergies. Current Outpatient Prescriptions on File Prior to Visit  Medication Sig Dispense Refill  . aspirin 81 MG tablet Take 81 mg by mouth daily.    . calcium carbonate (OS-CAL) 600 MG TABS tablet Take 600 mg by mouth 2 (two) times daily with a meal.    . Cholecalciferol (VITAMIN D-3) 5000 UNITS TABS Take by mouth.    [name] exemestane (AROMASIN) 25 MG tablet TAKE 1 TABLET BY MOUTH DAILY 90 tablet 3   No current facility-administered medications on file prior to visit.     Social History  Substance Use Topics  . Smoking status: Never Smoker  . Smokeless tobacco: Never Used  . Alcohol use No    Review of Systems  Constitutional: Negative for chills and fever.  Respiratory: Negative for cough.   Cardiovascular: Negative for chest pain and palpitations.  Gastrointestinal: Negative for nausea and vomiting.      Objective:    BP 110/60   Pulse 64   Temp 98.4 F (36.9 C) (Oral)  Ht _0  (1.626 m)   Wt 137 lb (62.1 kg)   SpO2 98%   BMI 23.52 kg/m  BP Readings from Last 3 Encounters:  04/10/16 110/60  12/28/15 107/73  11/27/15 118/74   Wt Readings from Last 3 Encounters:  04/10/16 137 lb (62.1 kg)  12/28/15 136 lb (61.7 kg)  11/27/15 136 lb (61.7 kg)    Physical Exam  Constitutional: She appears well-developed and well-nourished.  Eyes: Conjunctivae are normal.  Neck: No thyroid mass and no thyromegaly present.  Cardiovascular: Normal rate, regular rhythm, normal heart sounds and normal pulses.   Pulmonary/Chest: Effort normal and breath sounds normal. She has no wheezes. She has no rhonchi. She has no rales.  Musculoskeletal:       Lumbar back: She exhibits normal range of motion, no tenderness, no bony tenderness, no swelling, no  edema, no pain and no spasm.  Full range of motion with flexion, tension, lateral side bends. No bony tenderness. No pain, numbness, tingling elicited with single leg raise bilaterally.   Lymphadenopathy:       Head (right side): No submental, no submandibular, no tonsillar, no preauricular, no posterior auricular and no occipital adenopathy present.       Head (left side): No submental, no submandibular, no tonsillar, no preauricular, no posterior auricular and no occipital adenopathy present.    She has no cervical adenopathy.  Neurological: She is alert. She has normal strength. No sensory deficit.  Reflex Scores:      Patellar reflexes are 2+ on the right side and 2+ on the left side. Sensation and strength intact bilateral lower extremities.  Skin: Skin is warm and dry.  Psychiatric: She has a normal mood and affect. Her speech is normal and behavior is normal. Thought content normal.  Vitals reviewed.      Assessment & Plan:   Problem List Items Addressed This Visit      Other   Breast cancer ([place])    In remission for the past 5 years. Continues with aromatase inhibitor. Follows with [facility] oncology.      Encounter to establish care - Primary    Reviewed past medical social and family history. Patient will return for CPE. ordered screening labs for CPE. We discussed doing At the next visit although her last Pap was in 2015, with family history of Lynch syndrome, I advised to do this sooner.      Relevant Orders   CBC with Differential/Platelet   Comprehensive metabolic panel   Hemoglobin A1c   TSH   VITAMIN D 25 Hydroxy (Vit-D Deficiency, Fractures)   Lipid panel   Hepatitis C antibody   Family history of Lynch syndrome    Patient has sister with Lynch syndrome which was diagnosed by dermatologist for skin lesion. Her father has a strong history of malignant polyp however no formal diagnosis of colon cancer. First degree relative with lynch syndrome, patient would meet  guidelines for genetic testing. She is concerned of cost at this time, and we decided that we would discuss this further at CPE. Went ahead and placed a referral to gastroenterology as patient's last colonoscopy was 2013 and due to her family history, she likely requires more frequent surveillance.      Relevant Orders   Ambulatory referral to Gastroenterology   Acute left-sided low back pain with bilateral sciatica    Symptoms consistent with impingement syndrome, sciatica. Patient has history of breast cancer so we decided to do lumbar x-ray. Otherwise, patient and  I jointly agreed on conservative management at this time with exercise. She politely declined any medication at this time.      Relevant Orders   DG Lumbar Spine Complete (Completed)    Other Visit Diagnoses   None.      I am having Ms. Demo maintain her Vitamin D-3, aspirin, calcium carbonate, and exemestane.   No orders of the defined types were placed in this encounter.   Return precautions given.   Risks, benefits, and alternatives of the medications and treatment plan prescribed today were discussed, and patient expressed understanding.   Education regarding symptom management and diagnosis given to patient on AVS.  Continue to follow with Mable Paris, FNP for routine health maintenance.   Cynthia Willis and I agreed with plan.   Mable Paris, FNP

## 2016-04-10 NOTE — Assessment & Plan Note (Signed)
In remission for the past 5 years. Continues with aromatase inhibitor. Follows with [facility] oncology.

## 2016-04-10 NOTE — Progress Notes (Signed)
Pre visit review using our clinic review tool, if applicable. No additional management support is needed unless otherwise documented below in the visit note. 

## 2016-04-10 NOTE — Patient Instructions (Addendum)
Pleasure meeting you.   Sciatica With Rehab The sciatic nerve runs from the back down the leg and is responsible for sensation and control of the muscles in the back (posterior) side of the thigh, lower leg, and foot. Sciatica is a condition that is characterized by inflammation of this nerve.  SYMPTOMS   Signs of nerve damage, including numbness and/or weakness along the posterior side of the lower extremity.  Pain in the back of the thigh that may also travel down the leg.  Pain that worsens when sitting for long periods of time.  Occasionally, pain in the back or buttock. CAUSES  Inflammation of the sciatic nerve is the cause of sciatica. The inflammation is due to something irritating the nerve. Common sources of irritation include:  Sitting for long periods of time.  Direct trauma to the nerve.  Arthritis of the spine.  Herniated or ruptured disk.  Slipping of the vertebrae (spondylolisthesis).  Pressure from soft tissues, such as muscles or ligament-like tissue (fascia). RISK INCREASES WITH:  Sports that place pressure or stress on the spine (football or weightlifting).  Poor strength and flexibility.  Failure to warm up properly before activity.  Family history of low back pain or disk disorders.  Previous back injury or surgery.  Poor body mechanics, especially when lifting, or poor posture. PREVENTION   Warm up and stretch properly before activity.  Maintain physical fitness:  Strength, flexibility, and endurance.  Cardiovascular fitness.  Learn and use proper technique, especially with posture and lifting. When possible, have coach correct improper technique.  Avoid activities that place stress on the spine. PROGNOSIS If treated properly, then sciatica usually resolves within 6 weeks. However, occasionally surgery is necessary.  RELATED COMPLICATIONS   Permanent nerve damage, including pain, numbness, tingle, or weakness.  Chronic back  pain.  Risks of surgery: infection, bleeding, nerve damage, or damage to surrounding tissues. TREATMENT Treatment initially involves resting from any activities that aggravate your symptoms. The use of ice and medication may help reduce pain and inflammation. The use of strengthening and stretching exercises may help reduce pain with activity. These exercises may be performed at home or with referral to a therapist. A therapist may recommend further treatments, such as transcutaneous electronic nerve stimulation (TENS) or ultrasound. Your caregiver may recommend corticosteroid injections to help reduce inflammation of the sciatic nerve. If symptoms persist despite non-surgical (conservative) treatment, then surgery may be recommended. MEDICATION  If pain medication is necessary, then nonsteroidal anti-inflammatory medications, such as aspirin and ibuprofen, or other minor pain relievers, such as acetaminophen, are often recommended.  Do not take pain medication for 7 days before surgery.  Prescription pain relievers may be given if deemed necessary by your caregiver. Use only as directed and only as much as you need.  Ointments applied to the skin may be helpful.  Corticosteroid injections may be given by your caregiver. These injections should be reserved for the most serious cases, because they may only be given a certain number of times. HEAT AND COLD  Cold treatment (icing) relieves pain and reduces inflammation. Cold treatment should be applied for 10 to 15 minutes every 2 to 3 hours for inflammation and pain and immediately after any activity that aggravates your symptoms. Use ice packs or massage the area with a piece of ice (ice massage).  Heat treatment may be used prior to performing the stretching and strengthening activities prescribed by your caregiver, physical therapist, or athletic trainer. Use a heat pack or soak  the injury in warm water. SEEK MEDICAL CARE IF:  Treatment seems  to offer no benefit, or the condition worsens.  Any medications produce adverse side effects. EXERCISES  RANGE OF MOTION (ROM) AND STRETCHING EXERCISES - Sciatica Most people with sciatic will find that their symptoms worsen with either excessive bending forward (flexion) or arching at the low back (extension). The exercises which will help resolve your symptoms will focus on the opposite motion. Your physician, physical therapist or athletic trainer will help you determine which exercises will be most helpful to resolve your low back pain. Do not complete any exercises without first consulting with your clinician. Discontinue any exercises which worsen your symptoms until you speak to your clinician. If you have pain, numbness or tingling which travels down into your buttocks, leg or foot, the goal of the therapy is for these symptoms to move closer to your back and eventually resolve. Occasionally, these leg symptoms will get better, but your low back pain may worsen; this is typically an indication of progress in your rehabilitation. Be certain to be very alert to any changes in your symptoms and the activities in which you participated in the 24 hours prior to the change. Sharing this information with your clinician will allow him/her to most efficiently treat your condition. These exercises may help you when beginning to rehabilitate your injury. Your symptoms may resolve with or without further involvement from your physician, physical therapist or athletic trainer. While completing these exercises, remember:   Restoring tissue flexibility helps normal motion to return to the joints. This allows healthier, less painful movement and activity.  An effective stretch should be held for at least 30 seconds.  A stretch should never be painful. You should only feel a gentle lengthening or release in the stretched tissue. FLEXION RANGE OF MOTION AND STRETCHING EXERCISES: STRETCH - Flexion, Single Knee to  Chest   Lie on a firm bed or floor with both legs extended in front of you.  Keeping one leg in contact with the floor, bring your opposite knee to your chest. Hold your leg in place by either grabbing behind your thigh or at your knee.  Pull until you feel a gentle stretch in your low back. Hold __________ seconds.  Slowly release your grasp and repeat the exercise with the opposite side. Repeat __________ times. Complete this exercise __________ times per day.  STRETCH - Flexion, Double Knee to Chest  Lie on a firm bed or floor with both legs extended in front of you.  Keeping one leg in contact with the floor, bring your opposite knee to your chest.  Tense your stomach muscles to support your back and then lift your other knee to your chest. Hold your legs in place by either grabbing behind your thighs or at your knees.  Pull both knees toward your chest until you feel a gentle stretch in your low back. Hold __________ seconds.  Tense your stomach muscles and slowly return one leg at a time to the floor. Repeat __________ times. Complete this exercise __________ times per day.  STRETCH - Low Trunk Rotation   Lie on a firm bed or floor. Keeping your legs in front of you, bend your knees so they are both pointed toward the ceiling and your feet are flat on the floor.  Extend your arms out to the side. This will stabilize your upper body by keeping your shoulders in contact with the floor.  Gently and slowly drop both knees  together to one side until you feel a gentle stretch in your low back. Hold for __________ seconds.  Tense your stomach muscles to support your low back as you bring your knees back to the starting position. Repeat the exercise to the other side. Repeat __________ times. Complete this exercise __________ times per day  EXTENSION RANGE OF MOTION AND FLEXIBILITY EXERCISES: STRETCH - Extension, Prone on Elbows  Lie on your stomach on the floor, a bed will be too  soft. Place your palms about shoulder width apart and at the height of your head.  Place your elbows under your shoulders. If this is too painful, stack pillows under your chest.  Allow your body to relax so that your hips drop lower and make contact more completely with the floor.  Hold this position for __________ seconds.  Slowly return to lying flat on the floor. Repeat __________ times. Complete this exercise __________ times per day.  RANGE OF MOTION - Extension, Prone Press Ups  Lie on your stomach on the floor, a bed will be too soft. Place your palms about shoulder width apart and at the height of your head.  Keeping your back as relaxed as possible, slowly straighten your elbows while keeping your hips on the floor. You may adjust the placement of your hands to maximize your comfort. As you gain motion, your hands will come more underneath your shoulders.  Hold this position __________ seconds.  Slowly return to lying flat on the floor. Repeat __________ times. Complete this exercise __________ times per day.  STRENGTHENING EXERCISES - Sciatica  These exercises may help you when beginning to rehabilitate your injury. These exercises should be done near your "sweet spot." This is the neutral, low-back arch, somewhere between fully rounded and fully arched, that is your least painful position. When performed in this safe range of motion, these exercises can be used for people who have either a flexion or extension based injury. These exercises may resolve your symptoms with or without further involvement from your physician, physical therapist or athletic trainer. While completing these exercises, remember:   Muscles can gain both the endurance and the strength needed for everyday activities through controlled exercises.  Complete these exercises as instructed by your physician, physical therapist or athletic trainer. Progress with the resistance and repetition exercises only as your  caregiver advises.  You may experience muscle soreness or fatigue, but the pain or discomfort you are trying to eliminate should never worsen during these exercises. If this pain does worsen, stop and make certain you are following the directions exactly. If the pain is still present after adjustments, discontinue the exercise until you can discuss the trouble with your clinician. STRENGTHENING - Deep Abdominals, Pelvic Tilt   Lie on a firm bed or floor. Keeping your legs in front of you, bend your knees so they are both pointed toward the ceiling and your feet are flat on the floor.  Tense your lower abdominal muscles to press your low back into the floor. This motion will rotate your pelvis so that your tail bone is scooping upwards rather than pointing at your feet or into the floor.  With a gentle tension and even breathing, hold this position for __________ seconds. Repeat __________ times. Complete this exercise __________ times per day.  STRENGTHENING - Abdominals, Crunches   Lie on a firm bed or floor. Keeping your legs in front of you, bend your knees so they are both pointed toward the ceiling and your  feet are flat on the floor. Cross your arms over your chest.  Slightly tip your chin down without bending your neck.  Tense your abdominals and slowly lift your trunk high enough to just clear your shoulder blades. Lifting higher can put excessive stress on the low back and does not further strengthen your abdominal muscles.  Control your return to the starting position. Repeat __________ times. Complete this exercise __________ times per day.  STRENGTHENING - Quadruped, Opposite UE/LE Lift  Assume a hands and knees position on a firm surface. Keep your hands under your shoulders and your knees under your hips. You may place padding under your knees for comfort.  Find your neutral spine and gently tense your abdominal muscles so that you can maintain this position. Your shoulders and  hips should form a rectangle that is parallel with the floor and is not twisted.  Keeping your trunk steady, lift your right hand no higher than your shoulder and then your left leg no higher than your hip. Make sure you are not holding your breath. Hold this position __________ seconds.  Continuing to keep your abdominal muscles tense and your back steady, slowly return to your starting position. Repeat with the opposite arm and leg. Repeat __________ times. Complete this exercise __________ times per day.  STRENGTHENING - Abdominals and Quadriceps, Straight Leg Raise   Lie on a firm bed or floor with both legs extended in front of you.  Keeping one leg in contact with the floor, bend the other knee so that your foot can rest flat on the floor.  Find your neutral spine, and tense your abdominal muscles to maintain your spinal position throughout the exercise.  Slowly lift your straight leg off the floor about 6 inches for a count of 15, making sure to not hold your breath.  Still keeping your neutral spine, slowly lower your leg all the way to the floor. Repeat this exercise with each leg __________ times. Complete this exercise __________ times per day. POSTURE AND BODY MECHANICS CONSIDERATIONS - Sciatica Keeping correct posture when sitting, standing or completing your activities will reduce the stress put on different body tissues, allowing injured tissues a chance to heal and limiting painful experiences. The following are general guidelines for improved posture. Your physician or physical therapist will provide you with any instructions specific to your needs. While reading these guidelines, remember:  The exercises prescribed by your provider will help you have the flexibility and strength to maintain correct postures.  The correct posture provides the optimal environment for your joints to work. All of your joints have less wear and tear when properly supported by a spine with good  posture. This means you will experience a healthier, less painful body.  Correct posture must be practiced with all of your activities, especially prolonged sitting and standing. Correct posture is as important when doing repetitive low-stress activities (typing) as it is when doing a single heavy-load activity (lifting). RESTING POSITIONS Consider which positions are most painful for you when choosing a resting position. If you have pain with flexion-based activities (sitting, bending, stooping, squatting), choose a position that allows you to rest in a less flexed posture. You would want to avoid curling into a fetal position on your side. If your pain worsens with extension-based activities (prolonged standing, working overhead), avoid resting in an extended position such as sleeping on your stomach. Most people will find more comfort when they rest with their spine in a more neutral position,  neither too rounded nor too arched. Lying on a non-sagging bed on your side with a pillow between your knees, or on your back with a pillow under your knees will often provide some relief. Keep in mind, being in any one position for a prolonged period of time, no matter how correct your posture, can still lead to stiffness. PROPER SITTING POSTURE In order to minimize stress and discomfort on your spine, you must sit with correct posture Sitting with good posture should be effortless for a healthy body. Returning to good posture is a gradual process. Many people can work toward this most comfortably by using various supports until they have the flexibility and strength to maintain this posture on their own. When sitting with proper posture, your ears will fall over your shoulders and your shoulders will fall over your hips. You should use the back of the chair to support your upper back. Your low back will be in a neutral position, just slightly arched. You may place a small pillow or folded towel at the base of your  low back for support.  When working at a desk, create an environment that supports good, upright posture. Without extra support, muscles fatigue and lead to excessive strain on joints and other tissues. Keep these recommendations in mind: CHAIR:   A chair should be able to slide under your desk when your back makes contact with the back of the chair. This allows you to work closely.  The chair's height should allow your eyes to be level with the upper part of your monitor and your hands to be slightly lower than your elbows. BODY POSITION  Your feet should make contact with the floor. If this is not possible, use a foot rest.  Keep your ears over your shoulders. This will reduce stress on your neck and low back. INCORRECT SITTING POSTURES   If you are feeling tired and unable to assume a healthy sitting posture, do not slouch or slump. This puts excessive strain on your back tissues, causing more damage and pain. Healthier options include:  Using more support, like a lumbar pillow.  Switching tasks to something that requires you to be upright or walking.  Talking a brief walk.  Lying down to rest in a neutral-spine position. PROLONGED STANDING WHILE SLIGHTLY LEANING FORWARD  When completing a task that requires you to lean forward while standing in one place for a long time, place either foot up on a stationary 2-4 inch high object to help maintain the best posture. When both feet are on the ground, the low back tends to lose its slight inward curve. If this curve flattens (or becomes too large), then the back and your other joints will experience too much stress, fatigue more quickly and can cause pain.  CORRECT STANDING POSTURES Proper standing posture should be assumed with all daily activities, even if they only take a few moments, like when brushing your teeth. As in sitting, your ears should fall over your shoulders and your shoulders should fall over your hips. You should keep a  slight tension in your abdominal muscles to brace your spine. Your tailbone should point down to the ground, not behind your body, resulting in an over-extended swayback posture.  INCORRECT STANDING POSTURES  Common incorrect standing postures include a forward head, locked knees and/or an excessive swayback. WALKING Walk with an upright posture. Your ears, shoulders and hips should all line-up. PROLONGED ACTIVITY IN A FLEXED POSITION When completing a task that  requires you to bend forward at your waist or lean over a low surface, try to find a way to stabilize 3 of 4 of your limbs. You can place a hand or elbow on your thigh or rest a knee on the surface you are reaching across. This will provide you more stability so that your muscles do not fatigue as quickly. By keeping your knees relaxed, or slightly bent, you will also reduce stress across your low back. CORRECT LIFTING TECHNIQUES DO :   Assume a wide stance. This will provide you more stability and the opportunity to get as close as possible to the object which you are lifting.  Tense your abdominals to brace your spine; then bend at the knees and hips. Keeping your back locked in a neutral-spine position, lift using your leg muscles. Lift with your legs, keeping your back straight.  Test the weight of unknown objects before attempting to lift them.  Try to keep your elbows locked down at your sides in order get the best strength from your shoulders when carrying an object.  Always ask for help when lifting heavy or awkward objects. INCORRECT LIFTING TECHNIQUES DO NOT:   Lock your knees when lifting, even if it is a small object.  Bend and twist. Pivot at your feet or move your feet when needing to change directions.  Assume that you cannot safely pick up a paperclip without proper posture.   This information is not intended to replace advice given to you by your health care provider. Make sure you discuss any questions you have  with your health care provider.   Document Released: 06/24/2005 Document Revised: 11/08/2014 Document Reviewed: 10/06/2008 Elsevier Interactive Patient Education [employer].

## 2016-04-10 NOTE — Assessment & Plan Note (Signed)
Symptoms consistent with impingement syndrome, sciatica. Patient has history of breast cancer so we decided to do lumbar x-ray. Otherwise, patient and I jointly agreed on conservative management at this time with exercise. She politely declined any medication at this time.

## 2016-04-11 ENCOUNTER — Telehealth: Payer: Self-pay | Admitting: Family

## 2016-04-11 NOTE — Telephone Encounter (Signed)
Patient has been informed of lab results.

## 2016-04-11 NOTE — Telephone Encounter (Signed)
Pt called back returning your call. Thank you!  Call pt @ [phone]

## 2016-04-11 NOTE — Telephone Encounter (Signed)
Left message for patient to return call back.  

## 2016-04-11 NOTE — Telephone Encounter (Signed)
Pt returned Broc's phone call. She is not sure what the call is about.

## 2016-04-12 ENCOUNTER — Encounter: Payer: Self-pay | Admitting: Family

## 2016-04-18 ENCOUNTER — Telehealth: Payer: Self-pay | Admitting: Family

## 2016-04-18 DIAGNOSIS — Z800 Family history of malignant neoplasm of digestive organs: Secondary | ICD-10-CM

## 2016-04-18 NOTE — Telephone Encounter (Signed)
Please call patient and let her know  Spoke with colleague about your sister and would like for you to see genetics counselor; they can run tests.   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

## 2016-04-18 NOTE — Telephone Encounter (Signed)
Patient has been informed.

## 2016-05-14 ENCOUNTER — Encounter: Payer: BLUE CROSS/BLUE SHIELD | Admitting: Oncology

## 2016-05-24 ENCOUNTER — Telehealth: Payer: Self-pay

## 2016-05-24 DIAGNOSIS — Z0000 Encounter for general adult medical examination without abnormal findings: Secondary | ICD-10-CM

## 2016-05-24 NOTE — Telephone Encounter (Signed)
Labs have been reordered to be drawn at [facility]

## 2016-05-25 LAB — LIPID PANEL
CHOL/HDL RATIO: 3.3 ratio (ref 0.0–4.4)
Cholesterol, Total: 143 mg/dL (ref 100–199)
HDL: 43 mg/dL (ref >39)
LDL CALC: 91 mg/dL (ref 0–99)
TRIGLYCERIDES: 47 mg/dL (ref 0–149)
VLDL Cholesterol Cal: 9 mg/dL (ref 5–40)

## 2016-05-25 LAB — CBC WITH DIFFERENTIAL/PLATELET
BASOS: 1 %
Basophils Absolute: 0 10*3/uL (ref 0.0–0.2)
EOS (ABSOLUTE): 0.6 10*3/uL — ABNORMAL HIGH (ref 0.0–0.4)
EOS: 10 %
HEMATOCRIT: 37.6 % (ref 34.0–46.6)
HEMOGLOBIN: 12.8 g/dL (ref 11.1–15.9)
IMMATURE GRANULOCYTES: 0 %
Immature Grans (Abs): 0 10*3/uL (ref 0.0–0.1)
Lymphocytes Absolute: 2.7 10*3/uL (ref 0.7–3.1)
Lymphs: 48 %
MCH: 31.2 pg (ref 26.6–33.0)
MCHC: 34 g/dL (ref 31.5–35.7)
MCV: 92 fL (ref 79–97)
MONOCYTES: 13 %
MONOS ABS: 0.7 10*3/uL (ref 0.1–0.9)
NEUTROS PCT: 28 %
Neutrophils Absolute: 1.6 10*3/uL (ref 1.4–7.0)
Platelets: 254 10*3/uL (ref 150–379)
RBC: 4.1 x10E6/uL (ref 3.77–5.28)
RDW: 13.3 % (ref 12.3–15.4)
WBC: 5.5 10*3/uL (ref 3.4–10.8)

## 2016-05-25 LAB — COMPREHENSIVE METABOLIC PANEL
ALBUMIN: 4.2 g/dL (ref 3.5–5.5)
ALT: 21 IU/L (ref 0–32)
AST: 28 IU/L (ref 0–40)
Albumin/Globulin Ratio: 1.4 (ref 1.2–2.2)
Alkaline Phosphatase: 107 IU/L (ref 39–117)
BUN / CREAT RATIO: 22 (ref 9–23)
BUN: 14 mg/dL (ref 6–24)
Bilirubin Total: 0.4 mg/dL (ref 0.0–1.2)
CALCIUM: 9.3 mg/dL (ref 8.7–10.2)
CO2: 25 mmol/L (ref 18–29)
CREATININE: 0.63 mg/dL (ref 0.57–1.00)
Chloride: 103 mmol/L (ref 96–106)
GFR calc Af Amer: 121 mL/min/{1.73_m2} (ref >59)
GFR, EST NON AFRICAN AMERICAN: 105 mL/min/{1.73_m2} (ref >59)
GLOBULIN, TOTAL: 3 g/dL (ref 1.5–4.5)
Glucose: 87 mg/dL (ref 65–99)
Potassium: 4 mmol/L (ref 3.5–5.2)
SODIUM: 144 mmol/L (ref 134–144)
Total Protein: 7.2 g/dL (ref 6.0–8.5)

## 2016-05-25 LAB — HEPATITIS C ANTIBODY: Hep C Virus Ab: 0.2 s/co ratio (ref 0.0–0.9)

## 2016-05-25 LAB — HEMOGLOBIN A1C
Est. average glucose Bld gHb Est-mCnc: 120 mg/dL
Hgb A1c MFr Bld: 5.8 % — ABNORMAL HIGH (ref 4.8–5.6)

## 2016-05-25 LAB — VITAMIN D 25 HYDROXY (VIT D DEFICIENCY, FRACTURES): Vit D, 25-Hydroxy: 73.4 ng/mL (ref 30.0–100.0)

## 2016-05-25 LAB — TSH: TSH: 0.982 u[IU]/mL (ref 0.450–4.500)

## 2016-05-27 ENCOUNTER — Telehealth: Payer: Self-pay | Admitting: *Deleted

## 2016-05-27 NOTE — Telephone Encounter (Signed)
Pt requested lab results Pt contact [phone]

## 2016-05-27 NOTE — Telephone Encounter (Signed)
Patient has been notified. See result note.

## 2016-06-19 ENCOUNTER — Encounter: Payer: Self-pay | Admitting: Family

## 2016-06-19 ENCOUNTER — Ambulatory Visit (INDEPENDENT_AMBULATORY_CARE_PROVIDER_SITE_OTHER): Payer: BLUE CROSS/BLUE SHIELD | Admitting: Family

## 2016-06-19 VITALS — BP 112/80 | HR 61 | Temp 98.0°F | Ht 64.0 in | Wt 134.0 lb

## 2016-06-19 DIAGNOSIS — Z0000 Encounter for general adult medical examination without abnormal findings: Secondary | ICD-10-CM | POA: Diagnosis not present

## 2016-06-19 DIAGNOSIS — F411 Generalized anxiety disorder: Secondary | ICD-10-CM

## 2016-06-19 MED ORDER — HYDROXYZINE HCL 25 MG PO TABS: 25.0000 mg | ORAL_TABLET | Freq: Every day | ORAL | 0 refills | Status: DC | PRN

## 2016-06-19 NOTE — Progress Notes (Signed)
Subjective:    Patient ID: Cynthia Willis, female    DOB: 1965-11-20, 50 y.o.   MRN: [id number]  CC: Cynthia Willis is a 50 y.o. female who presents today for physical exam.    HPI: Anxiety- caregiver for parents. Had been on xanax once per week in past. No panic attacks. No depression. No thoughts of hurting herself or anyone else.     Colorectal Cancer Screening: Colonoscopy 2013, hyperplastic polyp identified in the sigmoid  04/21/2012  , Dr. [name]. Family H/o Lynch syndrome. Referred to genetics and plans to see them in new year. Breast Cancer Screening: h/o right breast cancer; follows with Oncology [facility]. Follows with [facility]. Benign mammogram 2017 Cervical Cancer Screening: 2015 normal per patient done by [facility].No vaginal bleeding or other complaints today Bone Health screening/DEXA for 65+: 12/2015. Osteopenia. On vitamin D and Ca.  Lung Cancer Screening: Doesn't have 30 year pack year history and age > 75 years.  Immunizations       Tetanus - Due        Pneumococcal - Not Candidate for.  Hepatitis C screening - Done HIV Screening- Due; will do with next labs since already done.  Labs: Done prior Exercise: Gets regular exercise.  Alcohol use: None Smoking/tobacco use: Nonsmoker.  Regular dental exams:UTD Wears seat belt: Yes. Skin-Follows with derm annually  HISTORY:  Past Medical History:  Diagnosis Date  . Breast cancer ([place])   . Cancer [facility]) 2013   November 05, 2011 wide excision and sentinel node biopsy for invasive ductal carcinoma involving the right breast.  . Carcinoma of right breast ([place]) 12/28/2014  . Chicken pox   . Cystitis   . Kidney stones   . Malignant neoplasm of upper-outer quadrant of female breast ([place]) November 05, 2011   T1C, N0, M0; ER 90%, PR 90%, HER-2/neu not over expressed. Oncotype DX: low risk of recurrence with antiestrogen alone at 8%.  . Migraines   . Personal history of malignant neoplasm of breast 2013    right    Past  Surgical History:  Procedure Laterality Date  . BREAST SURGERY Right 2013   Wide excision, mastoplasty, sentinel node biopsy.  . COLONOSCOPY  2013   hyperplastic polyp identified in the sigmoid  04/21/2012  , Dr. [name]  . DILATION AND CURETTAGE OF UTERUS  2001  . LASIK  2013  . OOPHORECTOMY  Nov 09 2013   Dr. [name]   Family History  Problem Relation Age of Onset  . Colon cancer Father   . Heart disease Father     mini stroke  . Cancer Maternal Aunt     breast  . Hyperlipidemia Mother       ALLERGIES: Patient has no known allergies.  Current Outpatient Prescriptions on File Prior to Visit  Medication Sig Dispense Refill  . aspirin 81 MG tablet Take 81 mg by mouth daily.    . calcium carbonate (OS-CAL) 600 MG TABS tablet Take 600 mg by mouth 2 (two) times daily with a meal.    . Cholecalciferol (VITAMIN D-3) 5000 UNITS TABS Take by mouth.    [name] exemestane (AROMASIN) 25 MG tablet TAKE 1 TABLET BY MOUTH DAILY 90 tablet 3   No current facility-administered medications on file prior to visit.     Social History  Substance Use Topics  . Smoking status: Never Smoker  . Smokeless tobacco: Never Used  . Alcohol use No    Review of Systems  Constitutional: Negative for chills, fever and unexpected weight change.  HENT: Negative for congestion.   Respiratory: Negative for cough.   Cardiovascular: Negative for chest pain, palpitations and leg swelling.  Gastrointestinal: Negative for nausea and vomiting.  Musculoskeletal: Negative for arthralgias and myalgias.  Skin: Negative for rash.  Neurological: Negative for headaches.  Hematological: Negative for adenopathy.  Psychiatric/Behavioral: Negative for confusion. The patient is nervous/anxious.       Objective:    BP 112/80   Pulse 61   Temp 98 F (36.7 C) (Oral)   Ht 5' 4" (1.626 m)   Wt 134 lb (60.8 kg)   SpO2 98%   BMI 23.00 kg/m   BP Readings from Last 3 Encounters:  06/19/16 112/80  04/10/16 110/60    12/28/15 107/73   Wt Readings from Last 3 Encounters:  06/19/16 134 lb (60.8 kg)  04/10/16 137 lb (62.1 kg)  12/28/15 136 lb (61.7 kg)    Physical Exam  Constitutional: She appears well-developed and well-nourished.  Eyes: Conjunctivae are normal.  Neck: No thyroid mass and no thyromegaly present.  Cardiovascular: Normal rate, regular rhythm, normal heart sounds and normal pulses.   Pulmonary/Chest: Effort normal and breath sounds normal. She has no wheezes. She has no rhonchi. She has no rales. Right breast exhibits no inverted nipple, no mass, no nipple discharge, no skin change and no tenderness. Left breast exhibits no inverted nipple, no mass, no nipple discharge, no skin change and no tenderness. Breasts are symmetrical.  CBE performed.   Lymphadenopathy:       Head (right side): No submental, no submandibular, no tonsillar, no preauricular, no posterior auricular and no occipital adenopathy present.       Head (left side): No submental, no submandibular, no tonsillar, no preauricular, no posterior auricular and no occipital adenopathy present.    She has no cervical adenopathy.       Right cervical: No superficial cervical, no deep cervical and no posterior cervical adenopathy present.      Left cervical: No superficial cervical, no deep cervical and no posterior cervical adenopathy present.    She has no axillary adenopathy.  Neurological: She is alert.  Skin: Skin is warm and dry.  Psychiatric: She has a normal mood and affect. Her speech is normal and behavior is normal. Thought content normal.  Vitals reviewed.      Assessment & Plan:   Problem List Items Addressed This Visit      Other   Routine physical examination    Up-to-date colonoscopy, mammogram, Pap smear. Screening labs done prior. DEXA scan does show osteopenia; patient is on vitamin D and calcium. She is a nonsmoker. She is due for her tetanus vaccine. Due for HIV screening, we will include this was next  lab work. She continues to follow with dermatology. No abnormal skin lesions seen. Encouraged continued exercise.      Generalized anxiety disorder - Primary    Trial PRN atarax. Discussed risks of BZDs. Follow up in 3 mos, PRN.       Relevant Medications   hydrOXYzine (ATARAX/VISTARIL) 25 MG tablet       I am having Ms. Mccarn start on hydrOXYzine. I am also having her maintain her Vitamin D-3, aspirin, calcium carbonate, and exemestane.   Meds ordered this encounter  Medications  . hydrOXYzine (ATARAX/VISTARIL) 25 MG tablet    Sig: Take 1 tablet (25 mg total) by mouth daily as needed for anxiety.    Dispense:  30 tablet  Refill:  0    Order Specific Question:   Supervising Provider    Answer:   Crecencio Mc [[id number]]    Return precautions given.   Risks, benefits, and alternatives of the medications and treatment plan prescribed today were discussed, and patient expressed understanding.   Education regarding symptom management and diagnosis given to patient on AVS.   Continue to follow with Mable Paris, FNP for routine health maintenance.   Cynthia Willis and I agreed with plan.   Mable Paris, FNP

## 2016-06-19 NOTE — Progress Notes (Signed)
Pre visit review using our clinic review tool, if applicable. No additional management support is needed unless otherwise documented below in the visit note. 

## 2016-06-19 NOTE — Assessment & Plan Note (Signed)
Trial PRN atarax. Discussed risks of BZDs. Follow up in 3 mos, PRN.

## 2016-06-19 NOTE — Assessment & Plan Note (Signed)
Up-to-date colonoscopy, mammogram, Pap smear. Screening labs done prior. DEXA scan does show osteopenia; patient is on vitamin D and calcium. She is a nonsmoker. She is due for her tetanus vaccine. Due for HIV screening, we will include this was next lab work. She continues to follow with dermatology. No abnormal skin lesions seen. Encouraged continued exercise.

## 2016-06-19 NOTE — Patient Instructions (Addendum)
Trial atarax as needed for anxiety  Your dexa scan shows osteopenia.  For post menopausal women, guidelines recommend a diet with 1200 mg of Calcium per day. If you are eating calcium rich foods, you do not need a calcium supplement. The body better absorbs the calcium that you eat over supplementation. If you do supplement, I recommend not supplementing the full 1200 mg/ day as this can lead to increased risk of cardiovascular disease. I recommend Calcium Citrate over the counter, and you may take a total of 600 to 800 mg per day in divided doses with meals for best absorption.   For bone health, you need adequate vitamin D, and I recommend you supplement as it is harder to do so with diet alone. I recommend cholecalciferol 800 units daily.  Also, please ensure you are following a diet high in calcium -- research shows better outcomes with dietary sources including kale, yogurt, broccolii, cheese, okra, almonds- to name a few.     Also remember that exercise is a great medicine for maintain and preserve bone health. Advise moderate exercise for 30 minutes , 3 times per week.    Health Maintenance, Female Introduction Adopting a healthy lifestyle and getting preventive care can go a long way to promote health and wellness. Talk with your health care provider about what schedule of regular examinations is right for you. This is a good chance for you to check in with your provider about disease prevention and staying healthy. In between checkups, there are plenty of things you can do on your own. Experts have done a lot of research about which lifestyle changes and preventive measures are most likely to keep you healthy. Ask your health care provider for more information. Weight and diet Eat a healthy diet  Be sure to include plenty of vegetables, fruits, low-fat dairy products, and lean protein.  Do not eat a lot of foods high in solid fats, added sugars, or salt.  Get regular exercise. This is  one of the most important things you can do for your health.  Most adults should exercise for at least 150 minutes each week. The exercise should increase your heart rate and make you sweat (moderate-intensity exercise).  Most adults should also do strengthening exercises at least twice a week. This is in addition to the moderate-intensity exercise. Maintain a healthy weight  Body mass index (BMI) is a measurement that can be used to identify possible weight problems. It estimates body fat based on height and weight. Your health care provider can help determine your BMI and help you achieve or maintain a healthy weight.  For females 4 years of age and older:  A BMI below 18.5 is considered underweight.  A BMI of 18.5 to 24.9 is normal.  A BMI of 25 to 29.9 is considered overweight.  A BMI of 30 and above is considered obese. Watch levels of cholesterol and blood lipids  You should start having your blood tested for lipids and cholesterol at 50 years of age, then have this test every 5 years.  You may need to have your cholesterol levels checked more often if:  Your lipid or cholesterol levels are high.  You are older than 50 years of age.  You are at high risk for heart disease. Cancer screening Lung Cancer  Lung cancer screening is recommended for adults 13-14 years old who are at high risk for lung cancer because of a history of smoking.  A yearly low-dose CT scan  of the lungs is recommended for people who:  Currently smoke.  Have quit within the past 15 years.  Have at least a 30-pack-year history of smoking. A pack year is smoking an average of one pack of cigarettes a day for 1 year.  Yearly screening should continue until it has been 15 years since you quit.  Yearly screening should stop if you develop a health problem that would prevent you from having lung cancer treatment. Breast Cancer  Practice breast self-awareness. This means understanding how your breasts  normally appear and feel.  It also means doing regular breast self-exams. Let your health care provider know about any changes, no matter how small.  If you are in your 20s or 30s, you should have a clinical breast exam (CBE) by a health care provider every 1-3 years as part of a regular health exam.  If you are 68 or older, have a CBE every year. Also consider having a breast X-ray (mammogram) every year.  If you have a family history of breast cancer, talk to your health care provider about genetic screening.  If you are at high risk for breast cancer, talk to your health care provider about having an MRI and a mammogram every year.  Breast cancer gene (BRCA) assessment is recommended for women who have family members with BRCA-related cancers. BRCA-related cancers include:  Breast.  Ovarian.  Tubal.  Peritoneal cancers.  Results of the assessment will determine the need for genetic counseling and BRCA1 and BRCA2 testing. Cervical Cancer  Your health care provider may recommend that you be screened regularly for cancer of the pelvic organs (ovaries, uterus, and vagina). This screening involves a pelvic examination, including checking for microscopic changes to the surface of your cervix (Pap test). You may be encouraged to have this screening done every 3 years, beginning at age 48.  For women ages 45-65, health care providers may recommend pelvic exams and Pap testing every 3 years, or they may recommend the Pap and pelvic exam, combined with testing for human papilloma virus (HPV), every 5 years. Some types of HPV increase your risk of cervical cancer. Testing for HPV may also be done on women of any age with unclear Pap test results.  Other health care providers may not recommend any screening for nonpregnant women who are considered low risk for pelvic cancer and who do not have symptoms. Ask your health care provider if a screening pelvic exam is right for you.  If you have had  past treatment for cervical cancer or a condition that could lead to cancer, you need Pap tests and screening for cancer for at least 20 years after your treatment. If Pap tests have been discontinued, your risk factors (such as having a new sexual partner) need to be reassessed to determine if screening should resume. Some women have medical problems that increase the chance of getting cervical cancer. In these cases, your health care provider may recommend more frequent screening and Pap tests. Colorectal Cancer  This type of cancer can be detected and often prevented.  Routine colorectal cancer screening usually begins at 50 years of age and continues through 50 years of age.  Your health care provider may recommend screening at an earlier age if you have risk factors for colon cancer.  Your health care provider may also recommend using home test kits to check for hidden blood in the stool.  A small camera at the end of a tube can be used to  examine your colon directly (sigmoidoscopy or colonoscopy). This is done to check for the earliest forms of colorectal cancer.  Routine screening usually begins at age 105.  Direct examination of the colon should be repeated every 5-10 years through 50 years of age. However, you may need to be screened more often if early forms of precancerous polyps or small growths are found. Skin Cancer  Check your skin from head to toe regularly.  Tell your health care provider about any new moles or changes in moles, especially if there is a change in a mole's shape or color.  Also tell your health care provider if you have a mole that is larger than the size of a pencil eraser.  Always use sunscreen. Apply sunscreen liberally and repeatedly throughout the day.  Protect yourself by wearing long sleeves, pants, a wide-brimmed hat, and sunglasses whenever you are outside. Heart disease, diabetes, and high blood pressure  High blood pressure causes heart disease  and increases the risk of stroke. High blood pressure is more likely to develop in:  People who have blood pressure in the high end of the normal range (130-139/85-89 mm Hg).  People who are overweight or obese.  People who are African American.  If you are 2-30 years of age, have your blood pressure checked every 3-5 years. If you are 63 years of age or older, have your blood pressure checked every year. You should have your blood pressure measured twice-once when you are at a hospital or clinic, and once when you are not at a hospital or clinic. Record the average of the two measurements. To check your blood pressure when you are not at a hospital or clinic, you can use:  An automated blood pressure machine at a pharmacy.  A home blood pressure monitor.  If you are between 28 years and 66 years old, ask your health care provider if you should take aspirin to prevent strokes.  Have regular diabetes screenings. This involves taking a blood sample to check your fasting blood sugar level.  If you are at a normal weight and have a low risk for diabetes, have this test once every three years after 50 years of age.  If you are overweight and have a high risk for diabetes, consider being tested at a younger age or more often. Preventing infection Hepatitis B  If you have a higher risk for hepatitis B, you should be screened for this virus. You are considered at high risk for hepatitis B if:  You were born in a country where hepatitis B is common. Ask your health care provider which countries are considered high risk.  Your parents were born in a high-risk country, and you have not been immunized against hepatitis B (hepatitis B vaccine).  You have HIV or AIDS.  You use needles to inject street drugs.  You live with someone who has hepatitis B.  You have had sex with someone who has hepatitis B.  You get hemodialysis treatment.  You take certain medicines for conditions, including  cancer, organ transplantation, and autoimmune conditions. Hepatitis C  Blood testing is recommended for:  Everyone born from [birth year] through 1965.  Anyone with known risk factors for hepatitis C. Sexually transmitted infections (STIs)  You should be screened for sexually transmitted infections (STIs) including gonorrhea and chlamydia if:  You are sexually active and are younger than 50 years of age.  You are older than 50 years of age and your health care provider  tells you that you are at risk for this type of infection.  Your sexual activity has changed since you were last screened and you are at an increased risk for chlamydia or gonorrhea. Ask your health care provider if you are at risk.  If you do not have HIV, but are at risk, it may be recommended that you take a prescription medicine daily to prevent HIV infection. This is called pre-exposure prophylaxis (PrEP). You are considered at risk if:  You are sexually active and do not regularly use condoms or know the HIV status of your partner(s).  You take drugs by injection.  You are sexually active with a partner who has HIV. Talk with your health care provider about whether you are at high risk of being infected with HIV. If you choose to begin PrEP, you should first be tested for HIV. You should then be tested every 3 months for as long as you are taking PrEP. Pregnancy  If you are premenopausal and you may become pregnant, ask your health care provider about preconception counseling.  If you may become pregnant, take 400 to 800 micrograms (mcg) of folic acid every day.  If you want to prevent pregnancy, talk to your health care provider about birth control (contraception). Osteoporosis and menopause  Osteoporosis is a disease in which the bones lose minerals and strength with aging. This can result in serious bone fractures. Your risk for osteoporosis can be identified using a bone density scan.  If you are 62 years of age  or older, or if you are at risk for osteoporosis and fractures, ask your health care provider if you should be screened.  Ask your health care provider whether you should take a calcium or vitamin D supplement to lower your risk for osteoporosis.  Menopause may have certain physical symptoms and risks.  Hormone replacement therapy may reduce some of these symptoms and risks. Talk to your health care provider about whether hormone replacement therapy is right for you. Follow these instructions at home:  Schedule regular health, dental, and eye exams.  Stay current with your immunizations.  Do not use any tobacco products including cigarettes, chewing tobacco, or electronic cigarettes.  If you are pregnant, do not drink alcohol.  If you are breastfeeding, limit how much and how often you drink alcohol.  Limit alcohol intake to no more than 1 drink per day for nonpregnant women. One drink equals 12 ounces of beer, 5 ounces of wine, or 1 ounces of hard liquor.  Do not use street drugs.  Do not share needles.  Ask your health care provider for help if you need support or information about quitting drugs.  Tell your health care provider if you often feel depressed.  Tell your health care provider if you have ever been abused or do not feel safe at home. This information is not intended to replace advice given to you by your health care provider. Make sure you discuss any questions you have with your health care provider. Document Released: 01/07/2011 Document Revised: 11/30/2015 Document Reviewed: 03/28/2015  2017 [employer]

## 2016-06-20 ENCOUNTER — Ambulatory Visit: Payer: BLUE CROSS/BLUE SHIELD

## 2016-06-21 ENCOUNTER — Ambulatory Visit: Payer: BLUE CROSS/BLUE SHIELD

## 2016-07-10 ENCOUNTER — Encounter: Payer: Self-pay | Admitting: Family Medicine

## 2016-07-10 DIAGNOSIS — C50919 Malignant neoplasm of unspecified site of unspecified female breast: Secondary | ICD-10-CM | POA: Diagnosis not present

## 2016-07-15 NOTE — Progress Notes (Signed)
[facility]  Telephone:(336) [phone]  Fax:(336) [place] DOB: [birth year]  MR#: [id number]  QRF#:[id number]  Patient Care Team: Burnard Hawthorne, FNP as PCP - General (Family Medicine) Robert Bellow, MD as Consulting Physician (General Surgery)  CHIEF COMPLAINT: Pathologic stage Ia ER/PR positive, HER-2 negative invasive carcinoma of the right breast, unspecified site.  INTERVAL HISTORY:  Patient returns to clinic today for routine six-month follow-up. She continues to tolerate Aromasin without significant side effects. She currently feels well and is asymptomatic. She has no neurologic complaints. She denies any recent fevers. She has a good appetite and denies weight loss. She has no chest pain or shortness of breath. She denies any nausea, vomiting, constipation, or diarrhea. She has no urinary complaints. Patient is no specific complaints today.   REVIEW OF SYSTEMS:   Review of Systems  Constitutional: Negative.  Negative for fever, malaise/fatigue and weight loss.  Respiratory: Negative.  Negative for cough and shortness of breath.   Cardiovascular: Negative.  Negative for chest pain and leg swelling.  Gastrointestinal: Negative.  Negative for abdominal pain.  Genitourinary: Negative.   Musculoskeletal: Negative.   Neurological: Negative.  Negative for weakness.  Psychiatric/Behavioral: Negative.  The patient is not nervous/anxious.     As per HPI. Otherwise, a complete review of systems is negative.  ONCOLOGY HISTORY: Oncology History   Chief Complaint/Diagnosis:   82. 51 year old female status was wide local excision and sentinel node biopsy for a clinical stage I (T1 C. N0 M0) ER/PR positive HER-2/neu negative invasive mammary carcinoma.PATIENT HAS    FISH RATIO OF 1.67 status post lumpectomy(April, 2013) Oncotype DX.  Lowest risk.  Score of 12 with 8-10% risk over   10 years for recurrent disease 2. Started on tamoxifen from August of  2013. 3. BRCA1 and BRCA2 no mutation identified (May of 2013) 4.Started on Lupron from December 23, 2012 tamoxifen was discontinued been patient was started aromasin 5.bilateral oophorectomy was done in May of 2015 Patient is in postmenopausal status and has been started on Aromasin     Breast cancer, right ([place])   10/27/2012 Initial Diagnosis    Breast cancer       PAST MEDICAL HISTORY: Past Medical History:  Diagnosis Date  . Breast cancer ([place])   . Cancer [facility]) 2013   November 05, 2011 wide excision and sentinel node biopsy for invasive ductal carcinoma involving the right breast.  . Carcinoma of right breast ([place]) 12/28/2014  . Chicken pox   . Cystitis   . Kidney stones   . Malignant neoplasm of upper-outer quadrant of female breast ([place]) November 05, 2011   T1C, N0, M0; ER 90%, PR 90%, HER-2/neu not over expressed. Oncotype DX: low risk of recurrence with antiestrogen alone at 8%.  . Migraines   . Personal history of malignant neoplasm of breast 2013    right    PAST SURGICAL HISTORY: Past Surgical History:  Procedure Laterality Date  . BREAST SURGERY Right 2013   Wide excision, mastoplasty, sentinel node biopsy.  . COLONOSCOPY  2013   hyperplastic polyp identified in the sigmoid  04/21/2012  , Dr. [name]  . DILATION AND CURETTAGE OF UTERUS  2001  . LASIK  2013  . OOPHORECTOMY  Nov 09 2013   Dr. [name]    FAMILY HISTORY Family History  Problem Relation Age of Onset  . Colon cancer Father   . Heart disease Father     mini stroke  .  Cancer Maternal Aunt     breast  . Hyperlipidemia Mother     GYNECOLOGIC HISTORY:  No LMP recorded. Patient is not currently having periods (Reason: Other).     ADVANCED DIRECTIVES:    HEALTH MAINTENANCE: Social History  Substance Use Topics  . Smoking status: Never Smoker  . Smokeless tobacco: Never Used  . Alcohol use No     Colonoscopy:  PAP:  Bone density: 12/2015  Mammogram: 11/2015  No Known Allergies  Current  Outpatient Prescriptions  Medication Sig Dispense Refill  . aspirin 81 MG tablet Take 81 mg by mouth daily.    . calcium carbonate (OS-CAL) 600 MG TABS tablet Take 600 mg by mouth daily.     . Cholecalciferol (VITAMIN D-3) 5000 UNITS TABS Take by mouth.    [name] exemestane (AROMASIN) 25 MG tablet TAKE 1 TABLET BY MOUTH DAILY 90 tablet 3  . hydrOXYzine (ATARAX/VISTARIL) 25 MG tablet Take 1 tablet (25 mg total) by mouth daily as needed for anxiety. 30 tablet 0   No current facility-administered medications for this visit.     OBJECTIVE: BP 108/71 (BP Location: Left Arm, Patient Position: Sitting)   Pulse 66   Temp 98 F (36.7 C) (Tympanic)   Resp 18   Wt 135 lb 7.6 oz (61.4 kg)   BMI 23.25 kg/m    Body mass index is 23.25 kg/m.    ECOG FS:0 - Asymptomatic  General: Well-developed, well-nourished, no acute distress. Eyes: Pink conjunctiva, anicteric sclera. Breasts: Patient requested exam be deferred today. Lungs: Clear to auscultation bilaterally. Heart: Regular rate and rhythm. No rubs, murmurs, or gallops. Abdomen: Soft, nontender, nondistended. No organomegaly noted, normoactive bowel sounds. Breast: Deferred per patient request as she just saw Dr. Bary Castilla last month  Musculoskeletal: No edema, cyanosis, or clubbing. Neuro: Alert, answering all questions appropriately. Cranial nerves grossly intact. Skin: No rashes or petechiae noted. Psych: Normal affect.   LAB RESULTS: CBC and MetC from [place] on July 10, 2016 reported within normal limits. CA-27-29 9.2.  STUDIES: No results found.  ASSESSMENT: Pathologic stage Ia ER/PR positive, HER-2 negative invasive carcinoma of the right breast, unspecified site.  PLAN:   1. Pathologic stage Ia ER/PR positive, HER-2 negative invasive carcinoma of the right breast, unspecified site: Patient is status post lumpectomy, radiation therapy. Oncotype DX score of 12, lowest risk. She initially started on tamoxifen in August 2013. Patient had  a bilateral oophorectomy performed in May 2015. Patient is currently on Aromasin with calcium and vitamin D and tolerating fairly well. Clinically there is no evidence of recurrent disease. Patient's mammogram was performed in May 2017 and reported as BI-RADS 2, benign. Patient also had a DEXA scan performed in June 2017 which showed mild osteopenia. Return to clinic in 6 months for further evaluation.  Patient expressed understanding and was in agreement with this plan. She also understands that She can call clinic at any time with any questions, concerns, or complaints.   Breast cancer [facility])   Staging form: Breast, AJCC 7th Edition     Clinical: Stage IA (T1c, N0, M0) - Unsigned   Lloyd Huger, MD   07/19/2016 1:36 PM

## 2016-07-16 ENCOUNTER — Other Ambulatory Visit: Payer: Self-pay | Admitting: Family

## 2016-07-16 DIAGNOSIS — F411 Generalized anxiety disorder: Secondary | ICD-10-CM

## 2016-07-17 ENCOUNTER — Inpatient Hospital Stay: Payer: BLUE CROSS/BLUE SHIELD | Attending: Oncology | Admitting: Oncology

## 2016-07-17 VITALS — BP 108/71 | HR 66 | Temp 98.0°F | Resp 18 | Wt 135.5 lb

## 2016-07-17 DIAGNOSIS — Z780 Asymptomatic menopausal state: Secondary | ICD-10-CM | POA: Insufficient documentation

## 2016-07-17 DIAGNOSIS — Z90722 Acquired absence of ovaries, bilateral: Secondary | ICD-10-CM | POA: Diagnosis not present

## 2016-07-17 DIAGNOSIS — Z800 Family history of malignant neoplasm of digestive organs: Secondary | ICD-10-CM | POA: Insufficient documentation

## 2016-07-17 DIAGNOSIS — C50411 Malignant neoplasm of upper-outer quadrant of right female breast: Secondary | ICD-10-CM | POA: Diagnosis not present

## 2016-07-17 DIAGNOSIS — Z79811 Long term (current) use of aromatase inhibitors: Secondary | ICD-10-CM

## 2016-07-17 DIAGNOSIS — Z7982 Long term (current) use of aspirin: Secondary | ICD-10-CM | POA: Insufficient documentation

## 2016-07-17 DIAGNOSIS — Z170 Estrogen receptor positive status [ER+]: Secondary | ICD-10-CM | POA: Diagnosis not present

## 2016-07-17 DIAGNOSIS — Z79899 Other long term (current) drug therapy: Secondary | ICD-10-CM

## 2016-07-17 DIAGNOSIS — Z87442 Personal history of urinary calculi: Secondary | ICD-10-CM | POA: Insufficient documentation

## 2016-07-17 DIAGNOSIS — C50911 Malignant neoplasm of unspecified site of right female breast: Secondary | ICD-10-CM

## 2016-07-17 DIAGNOSIS — Z803 Family history of malignant neoplasm of breast: Secondary | ICD-10-CM | POA: Insufficient documentation

## 2016-07-17 DIAGNOSIS — M8580 Other specified disorders of bone density and structure, unspecified site: Secondary | ICD-10-CM | POA: Diagnosis not present

## 2016-07-17 NOTE — Progress Notes (Signed)
Patient is here for follow up, she is doing ok 

## 2016-09-10 ENCOUNTER — Other Ambulatory Visit: Payer: Self-pay

## 2016-09-10 DIAGNOSIS — C50411 Malignant neoplasm of upper-outer quadrant of right female breast: Secondary | ICD-10-CM

## 2016-10-11 ENCOUNTER — Telehealth: Payer: Self-pay

## 2016-10-11 NOTE — Telephone Encounter (Signed)
  Oncology Nurse Navigator Documentation Received email from Ms. Burack inquiring about genetic testing. Her father was recently diagnosed with his fifth colorectal cancer. Her sister has uterine cancer and tested positive for lynch last year. She does believe her father was tested at [facility] and he was told he has lynch syndrome. She would like to make sure insurance covers it before having it done but would like to know for her children's sake. Message sent to Dr. Grayland Ormond regarding. Navigator Location: [facility] (10/11/16 1300)   )Navigator Encounter Type: Letter/Fax/Email (10/11/16 1300)                                                    Time Spent with Patient: 15 (10/11/16 1300)

## 2016-10-12 ENCOUNTER — Other Ambulatory Visit: Payer: Self-pay | Admitting: Oncology

## 2016-11-22 ENCOUNTER — Ambulatory Visit
Admission: RE | Admit: 2016-11-22 | Discharge: 2016-11-22 | Disposition: A | Discharge status: Home / Self Care | Payer: BLUE CROSS/BLUE SHIELD | Source: Ambulatory Visit | Attending: General Surgery | Admitting: General Surgery

## 2016-11-22 DIAGNOSIS — C50411 Malignant neoplasm of upper-outer quadrant of right female breast: Secondary | ICD-10-CM

## 2016-11-22 DIAGNOSIS — R928 Other abnormal and inconclusive findings on diagnostic imaging of breast: Secondary | ICD-10-CM | POA: Diagnosis not present

## 2016-11-27 ENCOUNTER — Ambulatory Visit (INDEPENDENT_AMBULATORY_CARE_PROVIDER_SITE_OTHER): Payer: BLUE CROSS/BLUE SHIELD | Admitting: General Surgery

## 2016-11-27 ENCOUNTER — Encounter: Payer: Self-pay | Admitting: General Surgery

## 2016-11-27 VITALS — BP 110/70 | HR 64 | Resp 12 | Ht 64.0 in | Wt 133.0 lb

## 2016-11-27 DIAGNOSIS — Z853 Personal history of malignant neoplasm of breast: Secondary | ICD-10-CM

## 2016-11-27 NOTE — Progress Notes (Signed)
Patient ID: Cynthia Willis, female   DOB: [birth year], 51 y.o.   MRN: [id number]  Chief Complaint  Patient presents with  . Follow-up    HPI Cynthia Willis is a 50 y.o. female who presents for a breast evaluation. The most recent mammogram was done on 11/22/2016 .  Patient does perform regular self breast checks and gets regular mammograms done.   No new breast issues. Her daughter is getting married soon.  Father and sister have been diagnosed with Lynch Syndrome.  HPI  Past Medical History:  Diagnosis Date  . Breast cancer ([employer])   . Cancer [facility]) 2013   November 05, 2011 wide excision and sentinel node biopsy for invasive ductal carcinoma involving the right breast.  . Carcinoma of right breast ([place]) 12/28/2014  . Chicken pox   . Cystitis   . Kidney stones   . Malignant neoplasm of upper-outer quadrant of female breast ([place]) 11/05/2011   T1C, N0, M0; ER 90%, PR 90%, HER-2/neu not over expressed. Oncotype DX score 14: low risk of recurrence with antiestrogen alone at 8%.  . Migraines   . Personal history of malignant neoplasm of breast 2013   BRCA negative, May 2013    Past Surgical History:  Procedure Laterality Date  . BREAST BIOPSY Right 10/2011   bx +   . BREAST LUMPECTOMY Right 11/05/2011   November 05, 2011 wide excision and sentinel node biopsy for invasive ductal carcinoma involving the right breast.  . BREAST SURGERY Right 2013   Wide excision, mastoplasty, sentinel node biopsy.  . COLONOSCOPY  2013   hyperplastic polyp identified in the sigmoid  04/21/2012  , Dr. [name]  . DILATION AND CURETTAGE OF UTERUS  2001  . LASIK  2013  . OOPHORECTOMY  Nov 09 2013   Dr. [name]    Family History  Problem Relation Age of Onset  . Colon cancer Father 55        48, 94, 7, 104, currently has at age 51  . Heart disease Father        mini stroke  . Other Father        lynch syndrome Dx at age 56 ?  [name] Cancer Maternal Aunt 7       breast  . Hyperlipidemia Mother   . Other  Sister 36       lynch syndrome    Social History Social History  Substance Use Topics  . Smoking status: Never Smoker  . Smokeless tobacco: Never Used  . Alcohol use No    No Known Allergies  Current Outpatient Prescriptions  Medication Sig Dispense Refill  . aspirin 81 MG tablet Take 81 mg by mouth daily.    . calcium carbonate (OS-CAL) 600 MG TABS tablet Take 600 mg by mouth daily.     . Cholecalciferol (VITAMIN D-3) 5000 UNITS TABS Take by mouth.    [name] exemestane (AROMASIN) 25 MG tablet TAKE 1 TABLET BY MOUTH DAILY 90 tablet 3  . hydrOXYzine (ATARAX/VISTARIL) 25 MG tablet Take 1 tablet (25 mg total) by mouth daily as needed for anxiety. 30 tablet 0   No current facility-administered medications for this visit.     Review of Systems Review of Systems  Constitutional: Negative.   Respiratory: Negative.   Cardiovascular: Negative.     Blood pressure 110/70, pulse 64, resp. rate 12, height '5\' 4"'  (1.626 m), weight 133 lb (60.3 kg).  Physical Exam Physical Exam  Constitutional: She is oriented to person,  place, and time. She appears well-developed and well-nourished.  HENT:  Mouth/Throat: Oropharynx is clear and moist.  Eyes: Conjunctivae are normal. No scleral icterus.  Neck: Neck supple.  Cardiovascular: Normal rate, regular rhythm and normal heart sounds.   Pulmonary/Chest: Effort normal and breath sounds normal. Right breast exhibits no inverted nipple, no mass, no nipple discharge, no skin change and no tenderness. Left breast exhibits no inverted nipple, no mass, no nipple discharge, no skin change and no tenderness.    Right breast > left breast. Right breast incision well healed.  Lymphadenopathy:    She has no cervical adenopathy.  Neurological: She is alert and oriented to person, place, and time.  Skin: Skin is warm and dry.  Psychiatric: Her behavior is normal.    Data Reviewed Bilateral mammograms dated 11/22/2016 were reviewed. Postsurgical changes.  BI-RADS-2.  Medical oncology note from January 2018 reviewed.  The patient's father and sister by report have tested positive for Lynch syndrome. (Her father's first cancer was at under age 18, for subsequent new malignancies in the colon. Presently under treatment with radiation and chemotherapy for a rectal recurrence.  Assessment    No evidence of recurrent right breast cancer.  Soon to complete 5 years of antiestrogen therapy. Candidate for breast cancer index testing.  Candidate for genetic testing for Lynch syndrome.    Most recent recorded colonoscopy is 2013. With her father's family history she should likely consider repeat exam this year. Prior studies completed with Dr. Gustavo Lah.  Plan     Breast Cancer Index and Lynch Syndrome testing based on family history.  The patient has been asked to return to the office in one year with a bilateral diagnostic mammogram.    HPI, Physical Exam, Assessment and Plan have been scribed under the direction and in the presence of Robert Bellow, MD.  Karie Fetch, RN  I have completed the exam and reviewed the above documentation for accuracy and completeness.  I agree with the above.  Haematologist has been used and any errors in dictation or transcription are unintentional.  Hervey Ard, M.D., F.A.C.S.  Robert Bellow 11/28/2016, 7:19 AM

## 2016-11-27 NOTE — Patient Instructions (Signed)
The patient is aware to call back for any questions or concerns. The patient has been asked to return to the office in one year with a bilateral diagnostic mammogram. 

## 2016-11-28 ENCOUNTER — Telehealth: Payer: Self-pay | Admitting: *Deleted

## 2016-11-28 ENCOUNTER — Encounter: Payer: Self-pay | Admitting: General Surgery

## 2016-11-28 NOTE — Telephone Encounter (Signed)
-----   Message from Robert Bellow, MD sent at 11/28/2016  7:23 AM EDT ----- Please confirm that the patient's last colonoscopy was in 2013. If this is correct, she should have a repeat exam every 5 years based on her father's history. Dr. Gustavo Lah has completed her past exams.

## 2016-11-28 NOTE — Telephone Encounter (Signed)
She will find out if Dr Gustavo Lah office has her in recalls and let them know she needs to have a colonoscopy done. She is aware I am working on getting detail information about genetic testing for Lynch Syndrome, she states ok and no rush.

## 2016-12-18 ENCOUNTER — Encounter: Payer: Self-pay | Admitting: General Surgery

## 2016-12-26 ENCOUNTER — Encounter: Payer: Self-pay | Admitting: *Deleted

## 2017-01-07 DIAGNOSIS — C50911 Malignant neoplasm of unspecified site of right female breast: Secondary | ICD-10-CM | POA: Diagnosis not present

## 2017-01-07 NOTE — Telephone Encounter (Signed)
Appointment for genetic testing has been made and will check into financial options for Breast Cancer Index, pt agrees.

## 2017-01-14 NOTE — Progress Notes (Signed)
[place]  Telephone:(336) [phone]  Fax:(336) [place] DOB: 1966/01/14  MR#: [id number]  MHD#:[id number]  Patient Care Team: Burnard Hawthorne, FNP as PCP - General (Family Medicine) Bary Castilla, Forest Gleason, MD as Consulting Physician (General Surgery)  CHIEF COMPLAINT: Pathologic stage Ia ER/PR positive, HER-2 negative invasive carcinoma of the right breast, unspecified site.  INTERVAL HISTORY:  Patient returns to clinic today for routine six-month follow-up. She continues to tolerate Aromasin without significant side effects. She currently feels well and is asymptomatic. She has no neurologic complaints. She denies any recent fevers. She has a good appetite and denies weight loss. She has no chest pain or shortness of breath. She denies any nausea, vomiting, constipation, or diarrhea. She has no urinary complaints. Patient is no specific complaints today.   REVIEW OF SYSTEMS:   Review of Systems  Constitutional: Negative.  Negative for fever, malaise/fatigue and weight loss.  Respiratory: Negative.  Negative for cough and shortness of breath.   Cardiovascular: Negative.  Negative for chest pain and leg swelling.  Gastrointestinal: Negative.  Negative for abdominal pain.  Genitourinary: Negative.   Musculoskeletal: Negative.   Skin: Negative.  Negative for rash.  Neurological: Negative.  Negative for sensory change and weakness.  Psychiatric/Behavioral: Negative.  The patient is not nervous/anxious.     As per HPI. Otherwise, a complete review of systems is negative.  ONCOLOGY HISTORY: Oncology History   Chief Complaint/Diagnosis:   78. 51 year old female status was wide local excision and sentinel node biopsy for a clinical stage I (T1 C. N0 M0) ER/PR positive HER-2/neu negative invasive mammary carcinoma.PATIENT HAS    FISH RATIO OF 1.67 status post lumpectomy(April, 2013) Oncotype DX.  Lowest risk.  Score of 12 with 8-10% risk over   10 years for  recurrent disease 2. Started on tamoxifen from August of 2013. 3. BRCA1 and BRCA2 no mutation identified (May of 2013) 4.Started on Lupron from December 23, 2012 tamoxifen was discontinued been patient was started aromasin 5.bilateral oophorectomy was done in May of 2015 Patient is in postmenopausal status and has been started on Aromasin     Breast cancer, right ([place])   10/27/2012 Initial Diagnosis    Breast cancer       PAST MEDICAL HISTORY: Past Medical History:  Diagnosis Date  . Breast cancer ([place])   . Cancer [facility]) 2013   November 05, 2011 wide excision and sentinel node biopsy for invasive ductal carcinoma involving the right breast.  . Carcinoma of right breast ([place]) 12/28/2014  . Chicken pox   . Cystitis   . Kidney stones   . Malignant neoplasm of upper-outer quadrant of female breast ([place]) 11/05/2011   T1C, N0, M0; ER 90%, PR 90%, HER-2/neu not over expressed. Oncotype DX score 14: low risk of recurrence with antiestrogen alone at 8%.  . Migraines   . Personal history of malignant neoplasm of breast 2013   BRCA negative, May 2013    PAST SURGICAL HISTORY: Past Surgical History:  Procedure Laterality Date  . BREAST BIOPSY Right 10/2011   bx +   . BREAST LUMPECTOMY Right 11/05/2011   November 05, 2011 wide excision and sentinel node biopsy for invasive ductal carcinoma involving the right breast.  . BREAST SURGERY Right 2013   Wide excision, mastoplasty, sentinel node biopsy.  . COLONOSCOPY  2013   hyperplastic polyp identified in the sigmoid  04/21/2012  , Dr. [name]  . DILATION AND CURETTAGE OF UTERUS  2001  .  LASIK  2013  . OOPHORECTOMY  Nov 09 2013   Dr. [name]    FAMILY HISTORY Family History  Problem Relation Age of Onset  . Colon cancer Father 9        48, 42, 16, 42, currently has at age 66  . Heart disease Father        mini stroke  . Other Father        lynch syndrome Dx at age 35 ?  [name] Cancer Maternal Aunt 90       breast  . Hyperlipidemia  Mother   . Other Sister 43       lynch syndrome    GYNECOLOGIC HISTORY:  No LMP recorded. Patient is not currently having periods (Reason: Other).     ADVANCED DIRECTIVES:    HEALTH MAINTENANCE: Social History  Substance Use Topics  . Smoking status: Never Smoker  . Smokeless tobacco: Never Used  . Alcohol use No     Colonoscopy:  PAP:  Bone density: 12/2015  Mammogram: 11/2015  No Known Allergies  Current Outpatient Prescriptions  Medication Sig Dispense Refill  . aspirin 81 MG tablet Take 81 mg by mouth daily.    . calcium carbonate (OS-CAL) 600 MG TABS tablet Take 600 mg by mouth daily.     . Cholecalciferol (VITAMIN D-3) 5000 UNITS TABS Take by mouth.    [name] exemestane (AROMASIN) 25 MG tablet TAKE 1 TABLET BY MOUTH DAILY 90 tablet 3  . hydrOXYzine (ATARAX/VISTARIL) 25 MG tablet Take 1 tablet (25 mg total) by mouth daily as needed for anxiety. (Patient not taking: Reported on 01/15/2017) 30 tablet 0   No current facility-administered medications for this visit.     OBJECTIVE: BP 110/73   Pulse 62   Temp (!) 97.4 F (36.3 C) (Tympanic)   Resp 18   Wt 137 lb 1.6 oz (62.2 kg)   BMI 23.53 kg/m    Body mass index is 23.53 kg/m.    ECOG FS:0 - Asymptomatic  General: Well-developed, well-nourished, no acute distress. Eyes: Pink conjunctiva, anicteric sclera. Breasts: Patient requested exam be deferred today. Lungs: Clear to auscultation bilaterally. Heart: Regular rate and rhythm. No rubs, murmurs, or gallops. Abdomen: Soft, nontender, nondistended. No organomegaly noted, normoactive bowel sounds. Breast: Deferred per patient request as she just saw Dr. Bary Castilla last month  Musculoskeletal: No edema, cyanosis, or clubbing. Neuro: Alert, answering all questions appropriately. Cranial nerves grossly intact. Skin: No rashes or petechiae noted. Psych: Normal affect.   LAB RESULTS: CBC and MetC from [place] on July 10, 2016 reported within normal limits. CA-27-29  9.2.  STUDIES: No results found.  ASSESSMENT: Pathologic stage Ia ER/PR positive, HER-2 negative invasive carcinoma of the right breast, unspecified site.  PLAN:   1. Pathologic stage Ia ER/PR positive, HER-2 negative invasive carcinoma of the right breast, unspecified site: Patient is status post lumpectomy, radiation therapy. Oncotype DX score of 12, low risk. She initially started on tamoxifen in August 2013. Patient had a bilateral oophorectomy performed in May 2015. Patient is currently on Aromasin. She has been instructed to complete 5 years of treatment in August 2018 and then discontinue. Given the low risk Oncotype and low stage of disease, there is likely no benefit and extending endocrine therapy for 7 or 10 years. Her most recent mammogram on Nov 22, 2016 was reported as BI-RADS 2. Repeat in May 2019. Return to clinic in 1 year for routine evaluation.  2. Osteopenia: Bone mineral density completed on  December 22, 2015 reported T score of -1.9. Continue monitoring per primary care. Continue calcium and vitamin D supplementation.  Approximately 30 minutes was spent in discussion of which greater than 50% was consultation.  Patient expressed understanding and was in agreement with this plan. She also understands that She can call clinic at any time with any questions, concerns, or complaints.   Breast cancer [facility])   Staging form: Breast, AJCC 7th Edition     Clinical: Stage IA (T1c, N0, M0) - Unsigned   Lloyd Huger, MD   01/15/2017 10:37 AM

## 2017-01-15 ENCOUNTER — Ambulatory Visit (INDEPENDENT_AMBULATORY_CARE_PROVIDER_SITE_OTHER): Payer: BLUE CROSS/BLUE SHIELD | Admitting: *Deleted

## 2017-01-15 ENCOUNTER — Inpatient Hospital Stay: Payer: BLUE CROSS/BLUE SHIELD | Attending: Oncology | Admitting: Oncology

## 2017-01-15 VITALS — BP 110/73 | HR 62 | Temp 97.4°F | Resp 18 | Wt 137.1 lb

## 2017-01-15 DIAGNOSIS — Z79811 Long term (current) use of aromatase inhibitors: Secondary | ICD-10-CM | POA: Insufficient documentation

## 2017-01-15 DIAGNOSIS — Z79899 Other long term (current) drug therapy: Secondary | ICD-10-CM | POA: Diagnosis not present

## 2017-01-15 DIAGNOSIS — Z780 Asymptomatic menopausal state: Secondary | ICD-10-CM | POA: Diagnosis not present

## 2017-01-15 DIAGNOSIS — Z7982 Long term (current) use of aspirin: Secondary | ICD-10-CM | POA: Insufficient documentation

## 2017-01-15 DIAGNOSIS — M8580 Other specified disorders of bone density and structure, unspecified site: Secondary | ICD-10-CM | POA: Insufficient documentation

## 2017-01-15 DIAGNOSIS — C50911 Malignant neoplasm of unspecified site of right female breast: Secondary | ICD-10-CM

## 2017-01-15 DIAGNOSIS — C50411 Malignant neoplasm of upper-outer quadrant of right female breast: Secondary | ICD-10-CM | POA: Diagnosis not present

## 2017-01-15 DIAGNOSIS — Z90722 Acquired absence of ovaries, bilateral: Secondary | ICD-10-CM | POA: Diagnosis not present

## 2017-01-15 DIAGNOSIS — Z170 Estrogen receptor positive status [ER+]: Secondary | ICD-10-CM | POA: Diagnosis not present

## 2017-01-15 DIAGNOSIS — Z923 Personal history of irradiation: Secondary | ICD-10-CM | POA: Diagnosis not present

## 2017-01-15 DIAGNOSIS — Z853 Personal history of malignant neoplasm of breast: Secondary | ICD-10-CM

## 2017-01-15 DIAGNOSIS — Z800 Family history of malignant neoplasm of digestive organs: Secondary | ICD-10-CM | POA: Insufficient documentation

## 2017-01-15 NOTE — Telephone Encounter (Signed)
Breast cancer index cash out of pocket $1700, will discuss at time of genetic testing appointment.

## 2017-01-15 NOTE — Progress Notes (Signed)
Patient ID: Cynthia Willis, female   DOB: 30-May-1966, 51 y.o.   MRN: [id number]   Patient here today for genetic testing. She is aware of Breast Cancer Index cost and declines(income is too much), Dr Bary Castilla aware.

## 2017-01-15 NOTE — Progress Notes (Signed)
Patient has questions regarding finishing her 5 years of Aromasin, seen by Dr. Bary Castilla this morning and discussed further planning as well.

## 2017-01-15 NOTE — Patient Instructions (Signed)
The patient is aware to call back for any questions or concerns.  

## 2017-01-30 DIAGNOSIS — Z1379 Encounter for other screening for genetic and chromosomal anomalies: Secondary | ICD-10-CM

## 2017-01-30 HISTORY — DX: Encounter for other screening for genetic and chromosomal anomalies: Z13.79

## 2017-02-05 ENCOUNTER — Encounter: Payer: Self-pay | Admitting: *Deleted

## 2017-02-05 ENCOUNTER — Telehealth: Payer: Self-pay | Admitting: *Deleted

## 2017-02-05 NOTE — Telephone Encounter (Signed)
Notified patient as instructed, patient pleased. Discussed follow-up appointments, patient agrees  

## 2017-02-11 ENCOUNTER — Encounter: Payer: Self-pay | Admitting: General Surgery

## 2017-03-04 DIAGNOSIS — R140 Abdominal distension (gaseous): Secondary | ICD-10-CM | POA: Diagnosis not present

## 2017-03-04 DIAGNOSIS — R109 Unspecified abdominal pain: Secondary | ICD-10-CM | POA: Diagnosis not present

## 2017-03-04 DIAGNOSIS — K588 Other irritable bowel syndrome: Secondary | ICD-10-CM | POA: Diagnosis not present

## 2017-05-02 ENCOUNTER — Encounter: Payer: Self-pay | Admitting: Oncology

## 2017-05-23 DIAGNOSIS — Z85828 Personal history of other malignant neoplasm of skin: Secondary | ICD-10-CM | POA: Diagnosis not present

## 2017-09-03 ENCOUNTER — Ambulatory Visit: Payer: BLUE CROSS/BLUE SHIELD | Admitting: Maternal Newborn

## 2017-09-03 ENCOUNTER — Ambulatory Visit (INDEPENDENT_AMBULATORY_CARE_PROVIDER_SITE_OTHER): Payer: BLUE CROSS/BLUE SHIELD | Admitting: Advanced Practice Midwife

## 2017-09-03 ENCOUNTER — Encounter: Payer: Self-pay | Admitting: Advanced Practice Midwife

## 2017-09-03 VITALS — BP 100/66 | HR 64 | Ht 66.0 in | Wt 139.0 lb

## 2017-09-03 DIAGNOSIS — Z124 Encounter for screening for malignant neoplasm of cervix: Secondary | ICD-10-CM | POA: Diagnosis not present

## 2017-09-03 DIAGNOSIS — Z01419 Encounter for gynecological examination (general) (routine) without abnormal findings: Secondary | ICD-10-CM

## 2017-09-03 NOTE — Progress Notes (Signed)
139l

## 2017-09-03 NOTE — Patient Instructions (Signed)
American Heart Association (AHA) Exercise Recommendation  Being physically active is important to prevent heart disease and stroke, the nation's No. 1and No. 5killers. To improve overall cardiovascular health, we suggest at least 150 minutes per week of moderate exercise or 75 minutes per week of vigorous exercise (or a combination of moderate and vigorous activity). Thirty minutes a day, five times a week is an easy goal to remember. You will also experience benefits even if you divide your time into two or three segments of 10 to 15 minutes per day.  For people who would benefit from lowering their blood pressure or cholesterol, we recommend 40 minutes of aerobic exercise of moderate to vigorous intensity three to four times a week to lower the risk for heart attack and stroke.  Physical activity is anything that makes you move your body and burn calories.  This includes things like climbing stairs or playing sports. Aerobic exercises benefit your heart, and include walking, jogging, swimming or biking. Strength and stretching exercises are best for overall stamina and flexibility.  The simplest, positive change you can make to effectively improve your heart health is to start walking. It's enjoyable, free, easy, social and great exercise. A walking program is flexible and boasts high success rates because people can stick with it. It's easy for walking to become a regular and satisfying part of life.   For Overall Cardiovascular Health:  At least 30 minutes of moderate-intensity aerobic activity at least 5 days per week for a total of 150  OR   At least 25 minutes of vigorous aerobic activity at least 3 days per week for a total of 75 minutes; or a combination of moderate- and vigorous-intensity aerobic activity  AND   Moderate- to high-intensity muscle-strengthening activity at least 2 days per week for additional health benefits.  For Lowering Blood Pressure and Cholesterol  An  average 40 minutes of moderate- to vigorous-intensity aerobic activity 3 or 4 times per week  What if I can't make it to the time goal? Something is always better than nothing! And everyone has to start somewhere. Even if you've been sedentary for years, today is the day you can begin to make healthy changes in your life. If you don't think you'll make it for 30 or 40 minutes, set a reachable goal for today. You can work up toward your overall goal by increasing your time as you get stronger. Don't let all-or-nothing thinking rob you of doing what you can every day.  Source:http://www.heart.org   Mediterranean Diet A Mediterranean diet refers to food and lifestyle choices that are based on the traditions of countries located on the [employer]. This way of eating has been shown to help prevent certain conditions and improve outcomes for people who have chronic diseases, like kidney disease and heart disease. What are tips for following this plan? Lifestyle  Cook and eat meals together with your family, when possible.  Drink enough fluid to keep your urine clear or pale yellow.  Be physically active every day. This includes: ? Aerobic exercise like running or swimming. ? Leisure activities like gardening, walking, or housework.  Get 7-8 hours of sleep each night.  If recommended by your health care provider, drink red wine in moderation. This means 1 glass a day for nonpregnant women and 2 glasses a day for men. A glass of wine equals 5 oz (150 mL). Reading food labels  Check the serving size of packaged foods. For foods such as rice  and pasta, the serving size refers to the amount of cooked product, not dry.  Check the total fat in packaged foods. Avoid foods that have saturated fat or trans fats.  Check the ingredients list for added sugars, such as corn syrup. Shopping  At the grocery store, buy most of your food from the areas near the walls of the store. This  includes: ? Fresh fruits and vegetables (produce). ? Grains, beans, nuts, and seeds. Some of these may be available in unpackaged forms or large amounts (in bulk). ? Fresh seafood. ? Poultry and eggs. ? Low-fat dairy products.  Buy whole ingredients instead of prepackaged foods.  Buy fresh fruits and vegetables in-season from local farmers markets.  Buy frozen fruits and vegetables in resealable bags.  If you do not have access to quality fresh seafood, buy precooked frozen shrimp or canned fish, such as tuna, salmon, or sardines.  Buy small amounts of raw or cooked vegetables, salads, or olives from the deli or salad bar at your store.  Stock your pantry so you always have certain foods on hand, such as olive oil, canned tuna, canned tomatoes, rice, pasta, and beans. Cooking  Cook foods with extra-virgin olive oil instead of using butter or other vegetable oils.  Have meat as a side dish, and have vegetables or grains as your main dish. This means having meat in small portions or adding small amounts of meat to foods like pasta or stew.  Use beans or vegetables instead of meat in common dishes like chili or lasagna.  Experiment with different cooking methods. Try roasting or broiling vegetables instead of steaming or sauteing them.  Add frozen vegetables to soups, stews, pasta, or rice.  Add nuts or seeds for added healthy fat at each meal. You can add these to yogurt, salads, or vegetable dishes.  Marinate fish or vegetables using olive oil, lemon juice, garlic, and fresh herbs. Meal planning  Plan to eat 1 vegetarian meal one day each week. Try to work up to 2 vegetarian meals, if possible.  Eat seafood 2 or more times a week.  Have healthy snacks readily available, such as: ? Vegetable sticks with hummus. ? [place] yogurt. ? Fruit and nut trail mix.  Eat balanced meals throughout the week. This includes: ? Fruit: 2-3 servings a day ? Vegetables: 4-5 servings a  day ? Low-fat dairy: 2 servings a day ? Fish, poultry, or lean meat: 1 serving a day ? Beans and legumes: 2 or more servings a week ? Nuts and seeds: 1-2 servings a day ? Whole grains: 6-8 servings a day ? Extra-virgin olive oil: 3-4 servings a day  Limit red meat and sweets to only a few servings a month What are my food choices?  Mediterranean diet ? Recommended ? Grains: Whole-grain pasta. Brown rice. Bulgar wheat. Polenta. Couscous. Whole-wheat bread. [place]. ? Vegetables: Artichokes. Beets. Broccoli. Cabbage. Carrots. Eggplant. Green beans. Chard. Kale. Spinach. Onions. Leeks. Peas. Squash. Tomatoes. Peppers. Radishes. ? Fruits: Apples. Apricots. Avocado. Berries. Bananas. Cherries. Dates. Figs. Grapes. Lemons. Melon. Oranges. Peaches. Plums. Pomegranate. ? Meats and other protein foods: Beans. Almonds. Sunflower seeds. Pine nuts. Peanuts. [place]. Salmon. Scallops. Shrimp. Tye. Tilapia. Clams. Oysters. Eggs. ? Dairy: Low-fat milk. Cheese. Greek yogurt. ? Beverages: Water. Red wine. Herbal tea. ? Fats and oils: Extra virgin olive oil. Avocado oil. Grape seed oil. ? Sweets and desserts: [place] yogurt with honey. Baked apples. Poached pears. Trail mix. ? Seasoning and other foods: Basil. Cilantro. Coriander.  Cumin. Mint. Parsley. Sage. Rosemary. Tarragon. Garlic. Oregano. Thyme. Pepper. Balsalmic vinegar. Tahini. Hummus. Tomato sauce. Olives. Mushrooms. ? Limit these ? Grains: Prepackaged pasta or rice dishes. Prepackaged cereal with added sugar. ? Vegetables: Deep fried potatoes (french fries). ? Fruits: Fruit canned in syrup. ? Meats and other protein foods: Beef. Pork. Lamb. Poultry with skin. Hot dogs. [place] Salines. ? Dairy: Ice cream. Sour cream. Whole milk. ? Beverages: Juice. Sugar-sweetened soft drinks. Beer. Liquor and spirits. ? Fats and oils: Butter. Canola oil. Vegetable oil. Beef fat (tallow). Lard. ? Sweets and desserts: Cookies. Cakes. Pies. Candy. ? Seasoning and other  foods: Mayonnaise. Premade sauces and marinades. ? The items listed may not be a complete list. Talk with your dietitian about what dietary choices are right for you. Summary  The Mediterranean diet includes both food and lifestyle choices.  Eat a variety of fresh fruits and vegetables, beans, nuts, seeds, and whole grains.  Limit the amount of red meat and sweets that you eat.  Talk with your health care provider about whether it is safe for you to drink red wine in moderation. This means 1 glass a day for nonpregnant women and 2 glasses a day for men. A glass of wine equals 5 oz (150 mL). This information is not intended to replace advice given to you by your health care provider. Make sure you discuss any questions you have with your health care provider. Document Released: 02/15/2016 Document Revised: 03/19/2016 Document Reviewed: 02/15/2016 Elsevier Interactive Patient Education  2018 [employer] Maintenance for Postmenopausal Women Menopause is a normal process in which your reproductive ability comes to an end. This process happens gradually over a span of months to years, usually between the ages of 8 and 54. Menopause is complete when you have missed 12 consecutive menstrual periods. It is important to talk with your health care provider about some of the most common conditions that affect postmenopausal women, such as heart disease, cancer, and bone loss (osteoporosis). Adopting a healthy lifestyle and getting preventive care can help to promote your health and wellness. Those actions can also lower your chances of developing some of these common conditions. What should I know about menopause? During menopause, you may experience a number of symptoms, such as:  Moderate-to-severe hot flashes.  Night sweats.  Decrease in sex drive.  Mood swings.  Headaches.  Tiredness.  Irritability.  Memory problems.  Insomnia.  Choosing to treat or not to treat menopausal  changes is an individual decision that you make with your health care provider. What should I know about hormone replacement therapy and supplements? Hormone therapy products are effective for treating symptoms that are associated with menopause, such as hot flashes and night sweats. Hormone replacement carries certain risks, especially as you become older. If you are thinking about using estrogen or estrogen with progestin treatments, discuss the benefits and risks with your health care provider. What should I know about heart disease and stroke? Heart disease, heart attack, and stroke become more likely as you age. This may be due, in part, to the hormonal changes that your body experiences during menopause. These can affect how your body processes dietary fats, triglycerides, and cholesterol. Heart attack and stroke are both medical emergencies. There are many things that you can do to help prevent heart disease and stroke:  Have your blood pressure checked at least every 1-2 years. High blood pressure causes heart disease and increases the risk of stroke.  If you are 55-79 years  old, ask your health care provider if you should take aspirin to prevent a heart attack or a stroke.  Do not use any tobacco products, including cigarettes, chewing tobacco, or electronic cigarettes. If you need help quitting, ask your health care provider.  It is important to eat a healthy diet and maintain a healthy weight. ? Be sure to include plenty of vegetables, fruits, low-fat dairy products, and lean protein. ? Avoid eating foods that are high in solid fats, added sugars, or salt (sodium).  Get regular exercise. This is one of the most important things that you can do for your health. ? Try to exercise for at least 150 minutes each week. The type of exercise that you do should increase your heart rate and make you sweat. This is known as moderate-intensity exercise. ? Try to do strengthening exercises at least  twice each week. Do these in addition to the moderate-intensity exercise.  Know your numbers.Ask your health care provider to check your cholesterol and your blood glucose. Continue to have your blood tested as directed by your health care provider.  What should I know about cancer screening? There are several types of cancer. Take the following steps to reduce your risk and to catch any cancer development as early as possible. Breast Cancer  Practice breast self-awareness. ? This means understanding how your breasts normally appear and feel. ? It also means doing regular breast self-exams. Let your health care provider know about any changes, no matter how small.  If you are 35 or older, have a clinician do a breast exam (clinical breast exam or CBE) every year. Depending on your age, family history, and medical history, it may be recommended that you also have a yearly breast X-ray (mammogram).  If you have a family history of breast cancer, talk with your health care provider about genetic screening.  If you are at high risk for breast cancer, talk with your health care provider about having an MRI and a mammogram every year.  Breast cancer (BRCA) gene test is recommended for women who have family members with BRCA-related cancers. Results of the assessment will determine the need for genetic counseling and BRCA1 and for BRCA2 testing. BRCA-related cancers include these types: ? Breast. This occurs in males or females. ? Ovarian. ? Tubal. This may also be called fallopian tube cancer. ? Cancer of the abdominal or pelvic lining (peritoneal cancer). ? Prostate. ? Pancreatic.  Cervical, Uterine, and Ovarian Cancer Your health care provider may recommend that you be screened regularly for cancer of the pelvic organs. These include your ovaries, uterus, and vagina. This screening involves a pelvic exam, which includes checking for microscopic changes to the surface of your cervix (Pap  test).  For women ages 21-65, health care providers may recommend a pelvic exam and a Pap test every three years. For women ages 69-65, they may recommend the Pap test and pelvic exam, combined with testing for human papilloma virus (HPV), every five years. Some types of HPV increase your risk of cervical cancer. Testing for HPV may also be done on women of any age who have unclear Pap test results.  Other health care providers may not recommend any screening for nonpregnant women who are considered low risk for pelvic cancer and have no symptoms. Ask your health care provider if a screening pelvic exam is right for you.  If you have had past treatment for cervical cancer or a condition that could lead to cancer, you need  Pap tests and screening for cancer for at least 20 years after your treatment. If Pap tests have been discontinued for you, your risk factors (such as having a new sexual partner) need to be reassessed to determine if you should start having screenings again. Some women have medical problems that increase the chance of getting cervical cancer. In these cases, your health care provider may recommend that you have screening and Pap tests more often.  If you have a family history of uterine cancer or ovarian cancer, talk with your health care provider about genetic screening.  If you have vaginal bleeding after reaching menopause, tell your health care provider.  There are currently no reliable tests available to screen for ovarian cancer.  Lung Cancer Lung cancer screening is recommended for adults 64-80 years old who are at high risk for lung cancer because of a history of smoking. A yearly low-dose CT scan of the lungs is recommended if you:  Currently smoke.  Have a history of at least 30 pack-years of smoking and you currently smoke or have quit within the past 15 years. A pack-year is smoking an average of one pack of cigarettes per day for one year.  Yearly screening  should:  Continue until it has been 15 years since you quit.  Stop if you develop a health problem that would prevent you from having lung cancer treatment.  Colorectal Cancer  This type of cancer can be detected and can often be prevented.  Routine colorectal cancer screening usually begins at age 80 and continues through age 53.  If you have risk factors for colon cancer, your health care provider may recommend that you be screened at an earlier age.  If you have a family history of colorectal cancer, talk with your health care provider about genetic screening.  Your health care provider may also recommend using home test kits to check for hidden blood in your stool.  A small camera at the end of a tube can be used to examine your colon directly (sigmoidoscopy or colonoscopy). This is done to check for the earliest forms of colorectal cancer.  Direct examination of the colon should be repeated every 5-10 years until age 41. However, if early forms of precancerous polyps or small growths are found or if you have a family history or genetic risk for colorectal cancer, you may need to be screened more often.  Skin Cancer  Check your skin from head to toe regularly.  Monitor any moles. Be sure to tell your health care provider: ? About any new moles or changes in moles, especially if there is a change in a mole's shape or color. ? If you have a mole that is larger than the size of a pencil eraser.  If any of your family members has a history of skin cancer, especially at a young age, talk with your health care provider about genetic screening.  Always use sunscreen. Apply sunscreen liberally and repeatedly throughout the day.  Whenever you are outside, protect yourself by wearing long sleeves, pants, a wide-brimmed hat, and sunglasses.  What should I know about osteoporosis? Osteoporosis is a condition in which bone destruction happens more quickly than new bone creation. After  menopause, you may be at an increased risk for osteoporosis. To help prevent osteoporosis or the bone fractures that can happen because of osteoporosis, the following is recommended:  If you are 56-43 years old, get at least 1,000 mg of calcium and at least 600  mg of vitamin D per day.  If you are older than age 22 but younger than age 75, get at least 1,200 mg of calcium and at least 600 mg of vitamin D per day.  If you are older than age 13, get at least 1,200 mg of calcium and at least 800 mg of vitamin D per day.  Smoking and excessive alcohol intake increase the risk of osteoporosis. Eat foods that are rich in calcium and vitamin D, and do weight-bearing exercises several times each week as directed by your health care provider. What should I know about how menopause affects my mental health? Depression may occur at any age, but it is more common as you become older. Common symptoms of depression include:  Low or sad mood.  Changes in sleep patterns.  Changes in appetite or eating patterns.  Feeling an overall lack of motivation or enjoyment of activities that you previously enjoyed.  Frequent crying spells.  Talk with your health care provider if you think that you are experiencing depression. What should I know about immunizations? It is important that you get and maintain your immunizations. These include:  Tetanus, diphtheria, and pertussis (Tdap) booster vaccine.  Influenza every year before the flu season begins.  Pneumonia vaccine.  Shingles vaccine.  Your health care provider may also recommend other immunizations. This information is not intended to replace advice given to you by your health care provider. Make sure you discuss any questions you have with your health care provider. Document Released: 08/16/2005 Document Revised: 01/12/2016 Document Reviewed: 03/28/2015 Elsevier Interactive Patient Education  2018 [employer].

## 2017-09-03 NOTE — Progress Notes (Signed)
Patient ID: Cynthia Willis, female   DOB: Nov 18, 1965, 52 y.o.   MRN: [id number]       Gynecology Annual Exam  PCP: Burnard Hawthorne, FNP  Chief Complaint:  Chief Complaint  Patient presents with  . Gynecologic Exam    History of Present Illness:Patient is a 52 y.o. G2P2002 presents for annual exam. The patient has no complaints today. She still has some hot flashes but states they are getting less severe.   LMP: Patient's last menstrual period was 10/26/2012. Patient is post menopausal Menarche:not applicable Postcoital Bleeding: no Dysmenorrhea: not applicable  The patient is sexually active. She denies dyspareunia. She does have some dryness with intercourse. The patient does perform self breast exams.  There is notable family history of breast or ovarian cancer in her family. She has had breast cancer.  The patient wears seatbelts: yes.   The patient has regular exercise: yes.    The patient denies current symptoms of depression.     Review of Systems: Review of Systems  Constitutional: Negative.   HENT: Negative.   Eyes: Negative.   Respiratory: Negative.   Cardiovascular: Negative.   Gastrointestinal: Negative.   Genitourinary: Negative.   Musculoskeletal: Negative.   Skin: Positive for itching.  Neurological: Negative.   Endo/Heme/Allergies:       Hot flashes  Psychiatric/Behavioral: Negative.     Past Medical History:  Past Medical History:  Diagnosis Date  . Basal cell carcinoma 05/2009   upper chest; Dr. [name]  . Breast cancer ([place])   . Cancer [facility]) 2013   November 05, 2011 wide excision and sentinel node biopsy for invasive ductal carcinoma involving the right breast.  . Carcinoma of right breast ([place]) 12/28/2014  . Chicken pox   . Colon polyps 2013   hyperplastic polyps x1  . Cystitis   . Genetic screening 01/30/2017   Invitae, variant of uncertain significant DICER1  . Hemorrhoids   . History of Papanicolaou smear of cervix 08/09/10; 11/03/13   -/-; -/-  . Kidney stones   . Malignant neoplasm of upper-outer quadrant of female breast ([place]) 11/05/2011   T1C, N0, M0; ER 90%, PR 90%, HER-2/neu not over expressed. Oncotype DX score 14: low risk of recurrence with antiestrogen alone at 8%.  . Menorrhagia   . Migraines   . Personal history of malignant neoplasm of breast 2013   BRCA negative, May 2013    Past Surgical History:  Past Surgical History:  Procedure Laterality Date  . basal cell skin excision  05/2009   Dr. [name]  . BREAST BIOPSY Right 10/2011   bx +   . BREAST LUMPECTOMY Right 11/05/2011   November 05, 2011 wide excision and sentinel node biopsy for invasive ductal carcinoma involving the right breast.  . BREAST SURGERY Right 2013   Wide excision, mastoplasty, sentinel node biopsy.  . COLONOSCOPY  2013   hyperplastic polyp identified in the sigmoid  04/21/2012  , Dr. [name]  . DILATION AND CURETTAGE OF UTERUS  2001  . LAPAROSCOPY  11/09/2013   iud removal  . LASIK  2013  . OOPHORECTOMY Bilateral Nov 09 2013   Dr. [name]    Gynecologic History:  Patient's last menstrual period was 10/26/2012. Last Pap: 4 years ago Results were:  no abnormalities  Last mammogram: 9 months ago Results were: BI-RAD II  Obstetric History: [id number]  Family History:  Family History  Problem Relation Age of Onset  . Colon cancer Father 70  31, 59, 64, 17, currently has at age 68  . Heart disease Father        mini stroke  . Other Father        lynch syndrome Dx at age 47 ?  [name] Cancer Maternal Aunt 35       breast; ovary  . Hyperlipidemia Mother   . Heart attack Mother   . Other Sister 83       lynch syndrome    Social History:  Social History   Socioeconomic History  . Marital status: Married    Spouse name: Not on file  . Number of children: 2  . Years of education: 76  . Highest education level: Not on file  Social Needs  . Financial resource strain: Not on file  . Food insecurity - worry: Not on  file  . Food insecurity - inability: Not on file  . Transportation needs - medical: Not on file  . Transportation needs - non-medical: Not on file  Occupational History  . Occupation: BUSINESS  Tobacco Use  . Smoking status: Never Smoker  . Smokeless tobacco: Never Used  Substance and Sexual Activity  . Alcohol use: No  . Drug use: No  . Sexual activity: Yes  Other Topics Concern  . Not on file  Social History Narrative  . Not on file    Allergies:  No Known Allergies  Medications: Prior to Admission medications   Medication Sig Start Date End Date Taking? Authorizing Provider  aspirin 81 MG tablet Take 81 mg by mouth daily.   Yes [provider]  calcium carbonate (OS-CAL) 600 MG TABS tablet Take 600 mg by mouth daily.    Yes [provider]  Cholecalciferol (VITAMIN D-3) 5000 UNITS TABS Take by mouth.   Yes [provider]    Physical Exam Vitals: Blood pressure 100/66, pulse 64, height 5' 6" (1.676 m), weight 139 lb (63 kg), last menstrual period 10/26/2012.  General: NAD HEENT: normocephalic, anicteric Thyroid: no enlargement, no palpable nodules Pulmonary: No increased work of breathing, CTAB Cardiovascular: RRR, distal pulses 2+ Breast: Breast symmetrical, no tenderness, no palpable nodules or masses, no skin or nipple retraction present, no nipple discharge.  No axillary or supraclavicular lymphadenopathy. Abdomen: NABS, soft, non-tender, non-distended.  Umbilicus without lesions.  No hepatomegaly, splenomegaly or masses palpable. No evidence of hernia  Genitourinary:  External: Normal external female genitalia.  Normal urethral meatus, normal  Bartholin's and Skene's glands.    Vagina: Normal vaginal mucosa, no evidence of prolapse.    Cervix: Grossly normal in appearance, no bleeding, no CMT  Uterus: deferred for no concerns/shared decision making    Adnexa: s/p oophorectomy  Rectal: deferred  Lymphatic: no evidence of inguinal  lymphadenopathy Extremities: no edema, erythema, or tenderness Neurologic: Grossly intact Psychiatric: mood appropriate, affect full  Assessment: 52 y.o. G2P2002 routine annual exam  Plan: Problem List Items Addressed This Visit    None    Visit Diagnoses    Well woman exam with routine gynecological exam    -  Primary   Relevant Orders   IGP, Aptima HPV   Cervical cancer screening       Relevant Orders   IGP, Aptima HPV      1) Mammogram - recommend yearly screening mammogram.  Mammogram Is up to date  2) STI screening  was offered and declined  3) ASCCP guidelines and rational discussed.  Patient opts for every 3 years screening interval  4) Osteoporosis  -  per USPTF routine screening DEXA at age 34   Consider FDA-approved medical therapies in postmenopausal women and men aged 79 years and older, based on the following: a) A hip or vertebral (clinical or morphometric) fracture b) T-score ? -2.5 at the femoral neck or spine after appropriate evaluation to exclude secondary causes C) Low bone mass (T-score between -1.0 and -2.5 at the femoral neck or spine) and a 10-year probability of a hip fracture ? 3% or a 10-year probability of a major osteoporosis-related fracture ? 20% based on the [place]-adapted WHO algorithm   5) Routine healthcare maintenance including cholesterol, diabetes screening discussed managed by PCP  6) Colonoscopy: had screening 6 years ago.  Screening recommended starting at age 53 for average risk individuals, age 31 for individuals deemed at increased risk (including African Americans) and recommended to continue until age 26.  For patient age 49-85 individualized approach is recommended.  Gold standard screening is via colonoscopy, Cologuard screening is an acceptable alternative for patient unwilling or unable to undergo colonoscopy.  "Colorectal cancer screening for average?risk adults: 2018 guideline update from the American Cancer Society"CA: A Cancer  Journal for Clinicians: Dec 04, 2016   7) Return in 1 year (on 09/03/2018) for annual established gyn.   Rod Can, [facility] 09/03/2017, 8:52 AM

## 2017-09-08 LAB — IGP, APTIMA HPV
HPV Aptima: NEGATIVE
PAP Smear Comment: 0

## 2017-09-26 ENCOUNTER — Other Ambulatory Visit: Payer: Self-pay

## 2017-09-26 DIAGNOSIS — Z853 Personal history of malignant neoplasm of breast: Secondary | ICD-10-CM

## 2017-11-20 DIAGNOSIS — H01131 Eczematous dermatitis of right upper eyelid: Secondary | ICD-10-CM | POA: Diagnosis not present

## 2017-11-20 DIAGNOSIS — H01139 Eczematous dermatitis of unspecified eye, unspecified eyelid: Secondary | ICD-10-CM | POA: Diagnosis not present

## 2017-11-24 ENCOUNTER — Ambulatory Visit
Admission: RE | Admit: 2017-11-24 | Discharge: 2017-11-24 | Disposition: A | Discharge status: Home / Self Care | Payer: BLUE CROSS/BLUE SHIELD | Source: Ambulatory Visit | Attending: General Surgery | Admitting: General Surgery

## 2017-11-24 DIAGNOSIS — Z853 Personal history of malignant neoplasm of breast: Secondary | ICD-10-CM | POA: Diagnosis not present

## 2017-11-24 DIAGNOSIS — R922 Inconclusive mammogram: Secondary | ICD-10-CM | POA: Diagnosis not present

## 2017-11-24 HISTORY — DX: Personal history of irradiation: Z92.3

## 2017-12-02 ENCOUNTER — Encounter: Payer: Self-pay | Admitting: General Surgery

## 2017-12-02 ENCOUNTER — Ambulatory Visit: Payer: BLUE CROSS/BLUE SHIELD | Admitting: General Surgery

## 2017-12-02 VITALS — BP 104/58 | HR 69 | Resp 13 | Ht 65.0 in | Wt 139.0 lb

## 2017-12-02 DIAGNOSIS — Z853 Personal history of malignant neoplasm of breast: Secondary | ICD-10-CM

## 2017-12-02 NOTE — Patient Instructions (Addendum)
Patient will be asked to return to the office in one year with a bilateral screening mammogram. The patient is aware to call back for any questions or concerns. 

## 2017-12-02 NOTE — Progress Notes (Signed)
Patient ID: Cynthia Willis, female   DOB: [birth year], 52 y.o.   MRN: [id number]  Chief Complaint  Patient presents with  . Follow-up    HPI Cynthia Willis is a 52 y.o. female who presents for a breast evaluation. The most recent mammogram was done on 11/24/2017.  Patient does perform regular self breast checks and gets regular mammograms done.    HPI  Past Medical History:  Diagnosis Date  . Cynthia Willis 05/2009   upper chest; Dr. [name]  . Breast cancer (Cynthia Willis)   . Cancer Cynthia Willis) 2013   November 05, 2011 wide excision and sentinel node biopsy for invasive ductal Willis involving the right breast.  . Willis of right breast (Cynthia Willis) 12/28/2014  . Chicken pox   . Colon polyps 2013   hyperplastic polyps x1  . Cystitis   . Genetic screening 01/30/2017   Invitae, variant of uncertain significant Cynthia Willis  . Hemorrhoids   . History of Papanicolaou smear of cervix 08/09/10; 11/03/13   -/-; -/-  . Kidney stones   . Malignant neoplasm of upper-outer quadrant of female breast (Cynthia Willis) 11/05/2011   T1C, N0, M0; ER 90%, PR 90%, HER-2/neu not over expressed. Oncotype DX score 14: low risk of recurrence with antiestrogen alone at 8%.  . Menorrhagia   . Migraines   . Personal history of malignant neoplasm of breast 2013   BRCA negative, May 2013  . Personal history of radiation therapy 2013   right breast cancer    Past Surgical History:  Procedure Laterality Date  . Cynthia cell skin excision  05/2009   Dr. [name]  . BREAST BIOPSY Right 10/2011   bx +   . BREAST LUMPECTOMY Right 11/05/2011   November 05, 2011 wide excision and sentinel node biopsy for invasive ductal Willis involving the right breast.  . BREAST SURGERY Right 2013   Wide excision, mastoplasty, sentinel node biopsy.  . COLONOSCOPY  2013   hyperplastic polyp identified in the sigmoid  04/21/2012  , Dr. [name]  . DILATION AND CURETTAGE OF UTERUS  2001  . LAPAROSCOPY  11/09/2013   iud removal  . LASIK  2013  .  OOPHORECTOMY Bilateral Nov 09 2013   Dr. [name]    Family History  Problem Relation Age of Onset  . Colon cancer Father 2        48, 33, 68, 56, currently has at age 52  . Heart disease Father        mini stroke  . Other Father        lynch syndrome Dx at age 58 ?  [name] Cancer Maternal Aunt 35       breast; ovary  . Hyperlipidemia Mother   . Heart attack Mother   . Other Sister 6       lynch syndrome    Social History Social History   Tobacco Use  . Smoking status: Never Smoker  . Smokeless tobacco: Never Used  Substance Use Topics  . Alcohol use: No  . Drug use: No    No Known Allergies  Current Outpatient Medications  Medication Sig Dispense Refill  . aspirin 81 MG tablet Take 81 mg by mouth daily.    . calcium carbonate (OS-CAL) 600 MG TABS tablet Take 600 mg by mouth daily.     . Cholecalciferol (VITAMIN D-3) 5000 UNITS TABS Take by mouth.     No current facility-administered medications for this visit.     Review of Systems Review  of Systems  Constitutional: Negative.   Respiratory: Negative.   Cardiovascular: Negative.     Blood pressure (!) 104/58, pulse 69, resp. rate 13, height '5\' 5"'  (1.651 m), weight 139 lb (63 kg), last menstrual period 10/26/2012.  Physical Exam Physical Exam  Constitutional: She is oriented to person, place, and time. She appears well-developed and well-nourished.  Eyes: Conjunctivae are normal. No scleral icterus.  Neck: Neck supple.  Cardiovascular: Normal rate, regular rhythm and normal heart sounds.  Pulmonary/Chest: Effort normal and breath sounds normal. Right breast exhibits no inverted nipple, no mass, no nipple discharge, no skin change and no tenderness. Left breast exhibits no inverted nipple, no mass, no nipple discharge, no skin change and no tenderness.  Lymphadenopathy:    She has no cervical adenopathy.    She has no axillary adenopathy.  Neurological: She is alert and oriented to person, place, and time.   Skin: Skin is warm and dry.    Data Reviewed Bilateral diagnostic mammograms dated Nov 24, 2017 were reviewed.  Dense breast.  BI-RADS-2.  Colonoscopy of April 06, 2012 showed an adenomatous polyp in the sigmoid colon.  Assessment    Stable breast exam now 6 years post conservative treatment of an upper outer quadrant lesion.  Family history of colon cancer in her father [name] syndrome).    Plan  I spoke with [name], MD her GI physician regarding timing for her follow-up exam.  He makes a good point that we do not know if her father's: Issues were related to simple adenomatous polyps or related to the Lynch syndrome, and that she should be on a 5-year follow-up.  His office will take responsibility for contacting the patient.  A message to this effect was left on the patient's cell phone.  The patient was encouraged to do monthly self-examination considering her modest breast volume and dense breast tissue.  She reports that she frequently "forgets".  Patient will be asked to return to the office in one year with a bilateral screening mammogram.The patient is aware to call back for any questions or concerns. Check breast monthly.   HPI, Physical Exam, Assessment and Plan have been scribed under the direction and in the presence of Hervey Ard, MD.  Gaspar Cola, CMA  I have completed the exam and reviewed the above documentation for accuracy and completeness.  I agree with the above.  Haematologist has been used and any errors in dictation or transcription are unintentional.  Hervey Ard, M.D., F.A.C.S.  [name] 12/02/2017, 4:27 PM

## 2017-12-03 ENCOUNTER — Encounter: Payer: Self-pay | Admitting: Family

## 2017-12-08 DIAGNOSIS — H01131 Eczematous dermatitis of right upper eyelid: Secondary | ICD-10-CM | POA: Diagnosis not present

## 2017-12-17 ENCOUNTER — Telehealth: Payer: Self-pay | Admitting: Family

## 2017-12-17 NOTE — Telephone Encounter (Signed)
Unread mychart Mail to pt  Hi Cynthia Willis,   I see you have not seen my MyChart message below-    Reviewing Dr Luz Brazen note this morning and noticed that you have not had a colonoscopy ( at least that I can see in system).   Are you set up with GI? I noted in chart you would need colonoscopy every 5 years.   I want to ensure you have done with your father's history.   Hope you are well and let me know how I can help.

## 2017-12-22 NOTE — Telephone Encounter (Signed)
Letter sent.

## 2018-01-06 DIAGNOSIS — Z800 Family history of malignant neoplasm of digestive organs: Secondary | ICD-10-CM | POA: Diagnosis not present

## 2018-01-21 ENCOUNTER — Ambulatory Visit: Payer: BLUE CROSS/BLUE SHIELD | Admitting: Oncology

## 2018-01-26 NOTE — Progress Notes (Signed)
[place]  Telephone:([phone]) [phone]  Fax:([phone]) [place] DOB: [birth year]  MR#: [id number]  BSJ#:[id number]  Patient Care Team: Burnard Hawthorne, FNP as PCP - General (Family Medicine) Bary Castilla, Forest Gleason, MD as Consulting Physician (General Surgery)  CHIEF COMPLAINT: Pathologic stage Ia ER/PR positive, HER-2 negative invasive carcinoma of the right breast, unspecified site.  INTERVAL HISTORY: Patient returns to clinic today for routine yearly evaluation.  She currently feels well and is asymptomatic.  Patient is now completed 5 years of Aromasin. She has no neurologic complaints.  She denies any recent fevers or illnesses.  She has a good appetite and denies weight loss. She has no chest pain or shortness of breath. She denies any nausea, vomiting, constipation, or diarrhea. She has no urinary complaints.  Patient feels at her baseline offers no specific complaints today.   REVIEW OF SYSTEMS:   Review of Systems  Constitutional: Negative.  Negative for fever, malaise/fatigue and weight loss.  Respiratory: Negative.  Negative for cough and shortness of breath.   Cardiovascular: Negative.  Negative for chest pain and leg swelling.  Gastrointestinal: Negative.  Negative for abdominal pain and constipation.  Genitourinary: Negative.  Negative for dysuria.  Musculoskeletal: Negative.  Negative for myalgias.  Skin: Negative.  Negative for rash.  Neurological: Negative.  Negative for sensory change, focal weakness, weakness and headaches.  Psychiatric/Behavioral: Negative.  The patient is not nervous/anxious.     As per HPI. Otherwise, a complete review of systems is negative.  ONCOLOGY HISTORY: Oncology History   Chief Complaint/Diagnosis:   72. 52 year old female status was wide local excision and sentinel node biopsy for a clinical stage I (T1 C. N0 M0) ER/PR positive HER-2/neu negative invasive mammary carcinoma.PATIENT HAS    FISH RATIO OF 1.67 status  post lumpectomy(April, 2013) Oncotype DX.  Lowest risk.  Score of 12 with 8-10% risk over   10 years for recurrent disease 2. Started on tamoxifen from August of 2013. 3. BRCA1 and BRCA2 no mutation identified (May of 2013) 4.Started on Lupron from December 23, 2012 tamoxifen was discontinued been patient was started aromasin 5.bilateral oophorectomy was done in May of 2015 Patient is in postmenopausal status and has been started on Aromasin     Breast cancer, right ([place])   10/27/2012 Initial Diagnosis    Breast cancer       PAST MEDICAL HISTORY: Past Medical History:  Diagnosis Date  . Basal cell carcinoma 05/2009   upper chest; Dr. [name]  . Breast cancer ([place])   . Cancer [place]) 2013   November 05, 2011 wide excision and sentinel node biopsy for invasive ductal carcinoma involving the right breast.  . Carcinoma of right breast ([place]) 12/28/2014  . Chicken pox   . Colon polyps 2013   hyperplastic polyps x1  . Cystitis   . Genetic screening 01/30/2017   Invitae, variant of uncertain significant DICER1  . Hemorrhoids   . History of Papanicolaou smear of cervix 08/09/10; 11/03/13   -/-; -/-  . Kidney stones   . Malignant neoplasm of upper-outer quadrant of female breast ([place]) 11/05/2011   T1C, N0, M0; ER 90%, PR 90%, HER-2/neu not over expressed. Oncotype DX score 14: low risk of recurrence with antiestrogen alone at 8%.  . Menorrhagia   . Migraines   . Personal history of malignant neoplasm of breast 2013   BRCA negative, May 2013  . Personal history of radiation therapy 2013   right breast cancer  PAST SURGICAL HISTORY: Past Surgical History:  Procedure Laterality Date  . basal cell skin excision  05/2009   Dr. [name]  . BREAST BIOPSY Right 10/2011   bx +   . BREAST LUMPECTOMY Right 11/05/2011   November 05, 2011 wide excision and sentinel node biopsy for invasive ductal carcinoma involving the right breast.  . BREAST SURGERY Right 2013   Wide excision, mastoplasty,  sentinel node biopsy.  . COLONOSCOPY  2013   hyperplastic polyp identified in the sigmoid  04/21/2012  , Dr. [name]  . DILATION AND CURETTAGE OF UTERUS  2001  . LAPAROSCOPY  11/09/2013   iud removal  . LASIK  2013  . OOPHORECTOMY Bilateral Nov 09 2013   Dr. [name]    FAMILY HISTORY Family History  Problem Relation Age of Onset  . Colon cancer Father 79        48, 33, 35, 25, currently has at age 68  . Heart disease Father        mini stroke  . Other Father        lynch syndrome Dx at age 24 ?  [name] Cancer Maternal Aunt 35       breast; ovary  . Hyperlipidemia Mother   . Heart attack Mother   . Other Sister 69       lynch syndrome    GYNECOLOGIC HISTORY:  Patient's last menstrual period was 10/26/2012.     ADVANCED DIRECTIVES:    HEALTH MAINTENANCE: Social History   Tobacco Use  . Smoking status: Never Smoker  . Smokeless tobacco: Never Used  Substance Use Topics  . Alcohol use: No  . Drug use: No     Colonoscopy:  PAP:  Bone density: 12/2015  Mammogram: 11/2015  No Known Allergies  Current Outpatient Medications  Medication Sig Dispense Refill  . aspirin 81 MG tablet Take 81 mg by mouth daily.    . calcium carbonate (OS-CAL) 600 MG TABS tablet Take 600 mg by mouth daily.     . Cholecalciferol (VITAMIN D-3) 5000 UNITS TABS Take by mouth every morning.     . pantoprazole (PROTONIX) 40 MG tablet Take by mouth.     No current facility-administered medications for this visit.     OBJECTIVE: BP 109/76 (BP Location: Left Arm, Patient Position: Sitting)   Pulse 60   Temp (!) 96.1 F (35.6 C) (Tympanic)   Resp 18   Wt 139 lb 9.6 oz (63.3 kg)   LMP 10/26/2012   BMI 23.23 kg/m    Body mass index is 23.23 kg/m.    ECOG FS:0 - Asymptomatic  General: Well-developed, well-nourished, no acute distress. Eyes: Pink conjunctiva, anicteric sclera. HEENT: Normocephalic, moist mucous membranes. Breast: Bilateral breast and axilla without lumps or masses. Lungs:  Clear to auscultation bilaterally. Heart: Regular rate and rhythm. No rubs, murmurs, or gallops. Abdomen: Soft, nontender, nondistended. No organomegaly noted, normoactive bowel sounds. Musculoskeletal: No edema, cyanosis, or clubbing. Neuro: Alert, answering all questions appropriately. Cranial nerves grossly intact. Skin: No rashes or petechiae noted. Psych: Normal affect.   LAB RESULTS: CBC and MetC from [facility] on July 10, 2016 reported within normal limits. CA-27-29 9.2.  STUDIES: No results found.  ASSESSMENT: Pathologic stage Ia ER/PR positive, HER-2 negative invasive carcinoma of the right breast, unspecified site.  PLAN:   1. Pathologic stage Ia ER/PR positive, HER-2 negative invasive carcinoma of the right breast, unspecified site: Patient is status post lumpectomy, radiation therapy. Oncotype DX score of 12, low risk.  She initially started on tamoxifen in August 2013. Patient had a bilateral oophorectomy performed in May 2015.  Patient completed 5 years of Aromasin treatment in August 2018.  Given the low risk Oncotype and low stage of disease, there is likely no benefit and extending endocrine therapy for 7 or 10 years.  Her most recent mammogram on Nov 24, 2017 was reported as BI-RADS 2.  Repeat in May 2020.  Return to clinic in 1 year for routine evaluation.    2. Osteopenia: Bone mineral density completed on December 22, 2015 reported T score of -1.9. Continue monitoring per primary care. Continue calcium and vitamin D supplementation.  I spent a total of 20 minutes face-to-face with the patient of which greater than 50% of the visit was spent in counseling and coordination of care as detailed above.   Patient expressed understanding and was in agreement with this plan. She also understands that She can call clinic at any time with any questions, concerns, or complaints.   Breast cancer [facility])   Staging form: Breast, AJCC 7th Edition     Clinical: Stage IA (T1c, N0, M0) -  Unsigned   Lloyd Huger, MD   01/30/2018 10:57 AM

## 2018-01-27 ENCOUNTER — Other Ambulatory Visit: Payer: Self-pay

## 2018-01-27 ENCOUNTER — Inpatient Hospital Stay: Payer: BLUE CROSS/BLUE SHIELD | Attending: Oncology | Admitting: Oncology

## 2018-01-27 VITALS — BP 109/76 | HR 60 | Temp 96.1°F | Resp 18 | Wt 139.6 lb

## 2018-01-27 DIAGNOSIS — C50911 Malignant neoplasm of unspecified site of right female breast: Secondary | ICD-10-CM

## 2018-01-27 DIAGNOSIS — Z853 Personal history of malignant neoplasm of breast: Secondary | ICD-10-CM | POA: Diagnosis not present

## 2018-01-27 DIAGNOSIS — M8580 Other specified disorders of bone density and structure, unspecified site: Secondary | ICD-10-CM | POA: Diagnosis not present

## 2018-01-27 DIAGNOSIS — Z170 Estrogen receptor positive status [ER+]: Secondary | ICD-10-CM

## 2018-01-27 NOTE — Progress Notes (Signed)
Here for follow up.  Overall stated she is "doing fine"

## 2018-04-14 DIAGNOSIS — K2950 Unspecified chronic gastritis without bleeding: Secondary | ICD-10-CM | POA: Diagnosis not present

## 2018-04-14 DIAGNOSIS — R1013 Epigastric pain: Secondary | ICD-10-CM | POA: Diagnosis not present

## 2018-04-14 DIAGNOSIS — K5730 Diverticulosis of large intestine without perforation or abscess without bleeding: Secondary | ICD-10-CM | POA: Diagnosis not present

## 2018-04-14 DIAGNOSIS — D122 Benign neoplasm of ascending colon: Secondary | ICD-10-CM | POA: Diagnosis not present

## 2018-04-14 DIAGNOSIS — Z800 Family history of malignant neoplasm of digestive organs: Secondary | ICD-10-CM | POA: Diagnosis not present

## 2018-04-14 DIAGNOSIS — Z1211 Encounter for screening for malignant neoplasm of colon: Secondary | ICD-10-CM | POA: Diagnosis not present

## 2018-04-23 LAB — HM COLONOSCOPY

## 2018-04-28 DIAGNOSIS — R198 Other specified symptoms and signs involving the digestive system and abdomen: Secondary | ICD-10-CM | POA: Diagnosis not present

## 2018-04-28 DIAGNOSIS — R1013 Epigastric pain: Secondary | ICD-10-CM | POA: Diagnosis not present

## 2018-05-13 DIAGNOSIS — R198 Other specified symptoms and signs involving the digestive system and abdomen: Secondary | ICD-10-CM | POA: Diagnosis not present

## 2018-05-13 DIAGNOSIS — R1013 Epigastric pain: Secondary | ICD-10-CM | POA: Diagnosis not present

## 2018-05-25 ENCOUNTER — Encounter: Payer: Self-pay | Admitting: Family

## 2018-05-25 ENCOUNTER — Other Ambulatory Visit: Payer: Self-pay | Admitting: Family

## 2018-05-25 ENCOUNTER — Ambulatory Visit (INDEPENDENT_AMBULATORY_CARE_PROVIDER_SITE_OTHER): Payer: BLUE CROSS/BLUE SHIELD | Admitting: Family

## 2018-05-25 VITALS — BP 120/82 | HR 71 | Temp 98.8°F | Resp 15 | Ht 65.75 in | Wt 138.5 lb

## 2018-05-25 DIAGNOSIS — R7989 Other specified abnormal findings of blood chemistry: Secondary | ICD-10-CM

## 2018-05-25 DIAGNOSIS — Z0000 Encounter for general adult medical examination without abnormal findings: Secondary | ICD-10-CM | POA: Diagnosis not present

## 2018-05-25 LAB — CBC WITH DIFFERENTIAL/PLATELET
Basophils Absolute: 0.1 10*3/uL (ref 0.0–0.1)
Basophils Relative: 1 % (ref 0.0–3.0)
EOS ABS: 0.5 10*3/uL (ref 0.0–0.7)
EOS PCT: 7.7 % — AB (ref 0.0–5.0)
HCT: 38.4 % (ref 36.0–46.0)
HEMOGLOBIN: 12.7 g/dL (ref 12.0–15.0)
Lymphocytes Relative: 50.6 % — ABNORMAL HIGH (ref 12.0–46.0)
Lymphs Abs: 3.1 10*3/uL (ref 0.7–4.0)
MCHC: 33 g/dL (ref 30.0–36.0)
MCV: 93.7 fl (ref 78.0–100.0)
Monocytes Absolute: 0.6 10*3/uL (ref 0.1–1.0)
Monocytes Relative: 9.7 % (ref 3.0–12.0)
Neutro Abs: 1.9 10*3/uL (ref 1.4–7.7)
Neutrophils Relative %: 31 % — ABNORMAL LOW (ref 43.0–77.0)
Platelets: 256 10*3/uL (ref 150.0–400.0)
RBC: 4.1 Mil/uL (ref 3.87–5.11)
RDW: 13.1 % (ref 11.5–15.5)
WBC: 6.2 10*3/uL (ref 4.0–10.5)

## 2018-05-25 LAB — COMPREHENSIVE METABOLIC PANEL
ALBUMIN: 4.2 g/dL (ref 3.5–5.2)
ALK PHOS: 74 U/L (ref 39–117)
ALT: 22 U/L (ref 0–35)
AST: 22 U/L (ref 0–37)
BILIRUBIN TOTAL: 0.3 mg/dL (ref 0.2–1.2)
BUN: 19 mg/dL (ref 6–23)
CO2: 28 mEq/L (ref 19–32)
CREATININE: 0.6 mg/dL (ref 0.40–1.20)
Calcium: 9.3 mg/dL (ref 8.4–10.5)
Chloride: 104 mEq/L (ref 96–112)
GFR: 111.54 mL/min (ref >60.00)
Glucose, Bld: 88 mg/dL (ref 70–99)
Potassium: 3.7 mEq/L (ref 3.5–5.1)
SODIUM: 141 meq/L (ref 135–145)
TOTAL PROTEIN: 7.5 g/dL (ref 6.0–8.3)

## 2018-05-25 LAB — LIPID PANEL
Cholesterol: 163 mg/dL (ref 0–200)
HDL: 56.8 mg/dL (ref >39.00)
LDL Cholesterol: 96 mg/dL (ref 0–99)
NONHDL: 106.58
Total CHOL/HDL Ratio: 3
Triglycerides: 51 mg/dL (ref 0.0–149.0)
VLDL: 10.2 mg/dL (ref 0.0–40.0)

## 2018-05-25 LAB — TSH: TSH: 1.19 u[IU]/mL (ref 0.35–4.50)

## 2018-05-25 LAB — VITAMIN D 25 HYDROXY (VIT D DEFICIENCY, FRACTURES): VITD: 46.47 ng/mL (ref 30.00–100.00)

## 2018-05-25 LAB — HEMOGLOBIN A1C: Hgb A1c MFr Bld: 5.8 % (ref 4.6–6.5)

## 2018-05-25 NOTE — Assessment & Plan Note (Signed)
Screening lab ordered today.  Advised that she speak with Dr. Grayland Ormond in regards to her family history of Lynch syndrome any other genetic testing which would be appropriate for her.  She is in particular interest in this due to her children.  Up-to-date on mammogram, clinical breast exam performed today.  Deferred pelvic exam in the absence of complaints and she follows with GYN.

## 2018-05-25 NOTE — Patient Instructions (Addendum)
I would advise you to speak with dr Grayland Ormond in regards to additional genetic evaluation.   Tetanus ( Tdap) vaccine when you can   Health Maintenance for Postmenopausal Women Menopause is a normal process in which your reproductive ability comes to an end. This process happens gradually over a span of months to years, usually between the ages of 66 and 34. Menopause is complete when you have missed 12 consecutive menstrual periods. It is important to talk with your health care provider about some of the most common conditions that affect postmenopausal women, such as heart disease, cancer, and bone loss (osteoporosis). Adopting a healthy lifestyle and getting preventive care can help to promote your health and wellness. Those actions can also lower your chances of developing some of these common conditions. What should I know about menopause? During menopause, you may experience a number of symptoms, such as:  Moderate-to-severe hot flashes.  Night sweats.  Decrease in sex drive.  Mood swings.  Headaches.  Tiredness.  Irritability.  Memory problems.  Insomnia.  Choosing to treat or not to treat menopausal changes is an individual decision that you make with your health care provider. What should I know about hormone replacement therapy and supplements? Hormone therapy products are effective for treating symptoms that are associated with menopause, such as hot flashes and night sweats. Hormone replacement carries certain risks, especially as you become older. If you are thinking about using estrogen or estrogen with progestin treatments, discuss the benefits and risks with your health care provider. What should I know about heart disease and stroke? Heart disease, heart attack, and stroke become more likely as you age. This may be due, in part, to the hormonal changes that your body experiences during menopause. These can affect how your body processes dietary fats, triglycerides, and  cholesterol. Heart attack and stroke are both medical emergencies. There are many things that you can do to help prevent heart disease and stroke:  Have your blood pressure checked at least every 1-2 years. High blood pressure causes heart disease and increases the risk of stroke.  If you are 54-59 years old, ask your health care provider if you should take aspirin to prevent a heart attack or a stroke.  Do not use any tobacco products, including cigarettes, chewing tobacco, or electronic cigarettes. If you need help quitting, ask your health care provider.  It is important to eat a healthy diet and maintain a healthy weight. ? Be sure to include plenty of vegetables, fruits, low-fat dairy products, and lean protein. ? Avoid eating foods that are high in solid fats, added sugars, or salt (sodium).  Get regular exercise. This is one of the most important things that you can do for your health. ? Try to exercise for at least 150 minutes each week. The type of exercise that you do should increase your heart rate and make you sweat. This is known as moderate-intensity exercise. ? Try to do strengthening exercises at least twice each week. Do these in addition to the moderate-intensity exercise.  Know your numbers.Ask your health care provider to check your cholesterol and your blood glucose. Continue to have your blood tested as directed by your health care provider.  What should I know about cancer screening? There are several types of cancer. Take the following steps to reduce your risk and to catch any cancer development as early as possible. Breast Cancer  Practice breast self-awareness. ? This means understanding how your breasts normally appear and feel. ?  It also means doing regular breast self-exams. Let your health care provider know about any changes, no matter how small.  If you are 51 or older, have a clinician do a breast exam (clinical breast exam or CBE) every year. Depending  on your age, family history, and medical history, it may be recommended that you also have a yearly breast X-ray (mammogram).  If you have a family history of breast cancer, talk with your health care provider about genetic screening.  If you are at high risk for breast cancer, talk with your health care provider about having an MRI and a mammogram every year.  Breast cancer (BRCA) gene test is recommended for women who have family members with BRCA-related cancers. Results of the assessment will determine the need for genetic counseling and BRCA1 and for BRCA2 testing. BRCA-related cancers include these types: ? Breast. This occurs in males or females. ? Ovarian. ? Tubal. This may also be called fallopian tube cancer. ? Cancer of the abdominal or pelvic lining (peritoneal cancer). ? Prostate. ? Pancreatic.  Cervical, Uterine, and Ovarian Cancer Your health care provider may recommend that you be screened regularly for cancer of the pelvic organs. These include your ovaries, uterus, and vagina. This screening involves a pelvic exam, which includes checking for microscopic changes to the surface of your cervix (Pap test).  For women ages 21-65, health care providers may recommend a pelvic exam and a Pap test every three years. For women ages 75-65, they may recommend the Pap test and pelvic exam, combined with testing for human papilloma virus (HPV), every five years. Some types of HPV increase your risk of cervical cancer. Testing for HPV may also be done on women of any age who have unclear Pap test results.  Other health care providers may not recommend any screening for nonpregnant women who are considered low risk for pelvic cancer and have no symptoms. Ask your health care provider if a screening pelvic exam is right for you.  If you have had past treatment for cervical cancer or a condition that could lead to cancer, you need Pap tests and screening for cancer for at least 20 years after  your treatment. If Pap tests have been discontinued for you, your risk factors (such as having a new sexual partner) need to be reassessed to determine if you should start having screenings again. Some women have medical problems that increase the chance of getting cervical cancer. In these cases, your health care provider may recommend that you have screening and Pap tests more often.  If you have a family history of uterine cancer or ovarian cancer, talk with your health care provider about genetic screening.  If you have vaginal bleeding after reaching menopause, tell your health care provider.  There are currently no reliable tests available to screen for ovarian cancer.  Lung Cancer Lung cancer screening is recommended for adults 42-24 years old who are at high risk for lung cancer because of a history of smoking. A yearly low-dose CT scan of the lungs is recommended if you:  Currently smoke.  Have a history of at least 30 pack-years of smoking and you currently smoke or have quit within the past 15 years. A pack-year is smoking an average of one pack of cigarettes per day for one year.  Yearly screening should:  Continue until it has been 15 years since you quit.  Stop if you develop a health problem that would prevent you from having lung cancer  treatment.  Colorectal Cancer  This type of cancer can be detected and can often be prevented.  Routine colorectal cancer screening usually begins at age 72 and continues through age 62.  If you have risk factors for colon cancer, your health care provider may recommend that you be screened at an earlier age.  If you have a family history of colorectal cancer, talk with your health care provider about genetic screening.  Your health care provider may also recommend using home test kits to check for hidden blood in your stool.  A small camera at the end of a tube can be used to examine your colon directly (sigmoidoscopy or colonoscopy).  This is done to check for the earliest forms of colorectal cancer.  Direct examination of the colon should be repeated every 5-10 years until age 26. However, if early forms of precancerous polyps or small growths are found or if you have a family history or genetic risk for colorectal cancer, you may need to be screened more often.  Skin Cancer  Check your skin from head to toe regularly.  Monitor any moles. Be sure to tell your health care provider: ? About any new moles or changes in moles, especially if there is a change in a mole's shape or color. ? If you have a mole that is larger than the size of a pencil eraser.  If any of your family members has a history of skin cancer, especially at a young age, talk with your health care provider about genetic screening.  Always use sunscreen. Apply sunscreen liberally and repeatedly throughout the day.  Whenever you are outside, protect yourself by wearing long sleeves, pants, a wide-brimmed hat, and sunglasses.  What should I know about osteoporosis? Osteoporosis is a condition in which bone destruction happens more quickly than new bone creation. After menopause, you may be at an increased risk for osteoporosis. To help prevent osteoporosis or the bone fractures that can happen because of osteoporosis, the following is recommended:  If you are 56-42 years old, get at least 1,000 mg of calcium and at least 600 mg of vitamin D per day.  If you are older than age 76 but younger than age 67, get at least 1,200 mg of calcium and at least 600 mg of vitamin D per day.  If you are older than age 40, get at least 1,200 mg of calcium and at least 800 mg of vitamin D per day.  Smoking and excessive alcohol intake increase the risk of osteoporosis. Eat foods that are rich in calcium and vitamin D, and do weight-bearing exercises several times each week as directed by your health care provider. What should I know about how menopause affects my mental  health? Depression may occur at any age, but it is more common as you become older. Common symptoms of depression include:  Low or sad mood.  Changes in sleep patterns.  Changes in appetite or eating patterns.  Feeling an overall lack of motivation or enjoyment of activities that you previously enjoyed.  Frequent crying spells.  Talk with your health care provider if you think that you are experiencing depression. What should I know about immunizations? It is important that you get and maintain your immunizations. These include:  Tetanus, diphtheria, and pertussis (Tdap) booster vaccine.  Influenza every year before the flu season begins.  Pneumonia vaccine.  Shingles vaccine.  Your health care provider may also recommend other immunizations. This information is not intended to replace  advice given to you by your health care provider. Make sure you discuss any questions you have with your health care provider. Document Released: 08/16/2005 Document Revised: 01/12/2016 Document Reviewed: 03/28/2015 Elsevier Interactive Patient Education  2018 [employer].

## 2018-05-25 NOTE — Progress Notes (Signed)
Subjective:    Patient ID: Cynthia Willis, female    DOB: 1966-06-03, 52 y.o.   MRN: [id number]  CC: [name] is a 52 y.o. female who presents today for physical exam.    HPI: Feels well today. No new complaints  Has recently been seen by GI for epigastric burning- following with [name]. Slight improvement with carafate.   Colorectal Cancer Screening: UTD 04/2018 ; repeat 5 years. Family h/o lynch syndrome.  Endoscopy: 04/2018 however unable to see report in Epic.  Breast Cancer Screening: Mammogram UTD; h/o right breast . Follows with Dr Grayland Ormond.  Cervical Cancer Screening: s/p oophorectomy; UTD, no malignancy, HPV. Follows with GYN. No vaginal bleeding.  Bone Health screening/DEXA for 65+: No increased fracture risk. Defer screening at this time. Lung Cancer Screening: Doesn't have 30 year pack year history and age > 36 years. Immunizations       Tetanus - due; declines in our office        HIV Screening- Candidate for , declines Labs: Screening labs today. Exercise: Gets regular exercise.  Alcohol use: None Smoking/tobacco use: Nonsmoker.  Regular dental exams:  Wears seat belt: Yes. Skin: follows with dermatology  HISTORY:  Past Medical History:  Diagnosis Date  . Basal cell carcinoma 05/2009   upper chest; Dr. [name]  . Breast cancer ([place])   . Cancer [facility]) 2013   November 05, 2011 wide excision and sentinel node biopsy for invasive ductal carcinoma involving the right breast.  . Carcinoma of right breast ([place]) 12/28/2014  . Chicken pox   . Colon polyps 2013   hyperplastic polyps x1  . Cystitis   . Genetic screening 01/30/2017   Invitae, variant of uncertain significant DICER1  . Hemorrhoids   . History of Papanicolaou smear of cervix 08/09/10; 11/03/13   -/-; -/-  . Kidney stones   . Malignant neoplasm of upper-outer quadrant of female breast ([place]) 11/05/2011   T1C, N0, M0; ER 90%, PR 90%, HER-2/neu not over expressed. Oncotype DX score 14: low risk of  recurrence with antiestrogen alone at 8%.  . Menorrhagia   . Migraines   . Personal history of malignant neoplasm of breast 2013   BRCA negative, May 2013  . Personal history of radiation therapy 2013   right breast cancer    Past Surgical History:  Procedure Laterality Date  . basal cell skin excision  05/2009   Dr. [name]  . BREAST BIOPSY Right 10/2011   bx +   . BREAST LUMPECTOMY Right 11/05/2011   November 05, 2011 wide excision and sentinel node biopsy for invasive ductal carcinoma involving the right breast.  . BREAST SURGERY Right 2013   Wide excision, mastoplasty, sentinel node biopsy.  . COLONOSCOPY  2013   hyperplastic polyp identified in the sigmoid  04/21/2012  , Dr. [name]  . DILATION AND CURETTAGE OF UTERUS  2001  . LAPAROSCOPY  11/09/2013   iud removal  . LASIK  2013  . OOPHORECTOMY Bilateral Nov 09 2013   Dr. [name]   Family History  Problem Relation Age of Onset  . Colon cancer Father 85        48, 92, 62, 31, currently has at age 63  . Heart disease Father        mini stroke  . Other Father        lynch syndrome Dx at age 25 ?  [name] Cancer Maternal Aunt 35       breast; ovary  .  Hyperlipidemia Mother   . Heart attack Mother   . Other Sister 25       lynch syndrome      ALLERGIES: Patient has no known allergies.  Current Outpatient Medications on File Prior to Visit  Medication Sig Dispense Refill  . aspirin 81 MG tablet Take 81 mg by mouth daily.    . Calcium-Magnesium-Vitamin D (CALCIUM MAGNESIUM PO) Take by mouth 2 (two) times daily.    . Cholecalciferol (VITAMIN D-3) 5000 UNITS TABS Take by mouth every morning.     . sucralfate (CARAFATE) 1 g tablet TAKE 1 TABLET (1 G TOTAL) BY MOUTH 2 (TWO) TIMES DAILY BEFORE MEALS  3   No current facility-administered medications on file prior to visit.     Social History   Tobacco Use  . Smoking status: Never Smoker  . Smokeless tobacco: Never Used  Substance Use Topics  . Alcohol use: No  .  Drug use: No    Review of Systems  Constitutional: Negative for chills, fever and unexpected weight change.  HENT: Negative for congestion.   Respiratory: Negative for cough.   Cardiovascular: Negative for chest pain, palpitations and leg swelling.  Gastrointestinal: Negative for nausea and vomiting.  Genitourinary: Negative for pelvic pain and vaginal bleeding.  Musculoskeletal: Negative for arthralgias and myalgias.  Skin: Negative for rash.  Neurological: Negative for headaches.  Hematological: Negative for adenopathy.  Psychiatric/Behavioral: Negative for confusion.      Objective:    BP 120/82 (BP Location: Left Arm, Patient Position: Sitting, Cuff Size: Normal)   Pulse 71   Temp 98.8 F (37.1 C) (Oral)   Resp 15   Ht 5' 5.75" (1.67 m)   Wt 138 lb 8 oz (62.8 kg)   LMP 10/26/2012   SpO2 98%   BMI 22.52 kg/m   BP Readings from Last 3 Encounters:  05/25/18 120/82  01/27/18 109/76  12/02/17 (!) 104/58   Wt Readings from Last 3 Encounters:  05/25/18 138 lb 8 oz (62.8 kg)  01/27/18 139 lb 9.6 oz (63.3 kg)  12/02/17 139 lb (63 kg)    Physical Exam  Constitutional: She appears well-developed and well-nourished.  Eyes: Conjunctivae are normal.  Neck: No thyroid mass and no thyromegaly present.  Cardiovascular: Normal rate, regular rhythm, normal heart sounds and normal pulses.  Pulmonary/Chest: Effort normal and breath sounds normal. She has no wheezes. She has no rhonchi. She has no rales. Right breast exhibits no inverted nipple, no mass, no nipple discharge, no skin change and no tenderness. Left breast exhibits no inverted nipple, no mass, no nipple discharge, no skin change and no tenderness. Breasts are symmetrical.  CBE performed.   Lymphadenopathy:       Head (right side): No submental, no submandibular, no tonsillar, no preauricular, no posterior auricular and no occipital adenopathy present.       Head (left side): No submental, no submandibular, no tonsillar,  no preauricular, no posterior auricular and no occipital adenopathy present.    She has no cervical adenopathy.       Right cervical: No superficial cervical, no deep cervical and no posterior cervical adenopathy present.      Left cervical: No superficial cervical, no deep cervical and no posterior cervical adenopathy present.    She has no axillary adenopathy.  Neurological: She is alert.  Skin: Skin is warm and dry.  Psychiatric: She has a normal mood and affect. Her speech is normal and behavior is normal. Thought content normal.  Vitals  reviewed.      Assessment & Plan:   Problem List Items Addressed This Visit      Other   Routine physical examination - Primary    Screening lab ordered today.  Advised that she speak with Dr. Grayland Ormond in regards to her family history of Lynch syndrome any other genetic testing which would be appropriate for her.  She is in particular interest in this due to her children.  Up-to-date on mammogram, clinical breast exam performed today.  Deferred pelvic exam in the absence of complaints and she follows with GYN.      Relevant Orders   CBC with Differential/Platelet (Completed)   Comprehensive metabolic panel (Completed)   Hemoglobin A1c (Completed)   Lipid panel (Completed)   TSH (Completed)   VITAMIN D 25 Hydroxy (Vit-D Deficiency, Fractures) (Completed)       I have discontinued Ian J. Vandeventer's calcium carbonate and pantoprazole. I am also having her maintain her Vitamin D-3, aspirin, sucralfate, and Calcium-Magnesium-Vitamin D (CALCIUM MAGNESIUM PO).   No orders of the defined types were placed in this encounter.   Return precautions given.   Risks, benefits, and alternatives of the medications and treatment plan prescribed today were discussed, and patient expressed understanding.   Education regarding symptom management and diagnosis given to patient on AVS.   Continue to follow with Burnard Hawthorne, FNP for routine health  maintenance.   Cynthia Willis and I agreed with plan.   Mable Paris, FNP

## 2018-06-15 ENCOUNTER — Other Ambulatory Visit (INDEPENDENT_AMBULATORY_CARE_PROVIDER_SITE_OTHER): Payer: BLUE CROSS/BLUE SHIELD

## 2018-06-15 DIAGNOSIS — R7989 Other specified abnormal findings of blood chemistry: Secondary | ICD-10-CM

## 2018-06-15 LAB — CBC WITH DIFFERENTIAL/PLATELET
BASOS ABS: 0.1 10*3/uL (ref 0.0–0.1)
Basophils Relative: 1.4 % (ref 0.0–3.0)
EOS ABS: 0.5 10*3/uL (ref 0.0–0.7)
Eosinophils Relative: 7.1 % — ABNORMAL HIGH (ref 0.0–5.0)
HCT: 38.9 % (ref 36.0–46.0)
Hemoglobin: 12.7 g/dL (ref 12.0–15.0)
Lymphocytes Relative: 47.6 % — ABNORMAL HIGH (ref 12.0–46.0)
Lymphs Abs: 3.2 10*3/uL (ref 0.7–4.0)
MCHC: 32.7 g/dL (ref 30.0–36.0)
MCV: 92.4 fl (ref 78.0–100.0)
MONO ABS: 0.7 10*3/uL (ref 0.1–1.0)
Monocytes Relative: 10.4 % (ref 3.0–12.0)
NEUTROS PCT: 33.5 % — AB (ref 43.0–77.0)
Neutro Abs: 2.3 10*3/uL (ref 1.4–7.7)
Platelets: 297 10*3/uL (ref 150.0–400.0)
RBC: 4.21 Mil/uL (ref 3.87–5.11)
RDW: 12.8 % (ref 11.5–15.5)
WBC: 6.8 10*3/uL (ref 4.0–10.5)

## 2018-06-19 NOTE — Addendum Note (Signed)
Addended by: Burnard Hawthorne on: 06/19/2018 11:56 AM   Modules accepted: Orders

## 2018-06-21 ENCOUNTER — Encounter: Payer: Self-pay | Admitting: Family

## 2018-06-23 ENCOUNTER — Other Ambulatory Visit: Payer: Self-pay | Admitting: Family

## 2018-06-23 DIAGNOSIS — D709 Neutropenia, unspecified: Secondary | ICD-10-CM

## 2018-07-06 ENCOUNTER — Other Ambulatory Visit (INDEPENDENT_AMBULATORY_CARE_PROVIDER_SITE_OTHER): Payer: BLUE CROSS/BLUE SHIELD

## 2018-07-06 DIAGNOSIS — D709 Neutropenia, unspecified: Secondary | ICD-10-CM | POA: Diagnosis not present

## 2018-07-06 DIAGNOSIS — R7989 Other specified abnormal findings of blood chemistry: Secondary | ICD-10-CM | POA: Diagnosis not present

## 2018-07-06 NOTE — Addendum Note (Signed)
Addended by: Arby Barrette on: 07/06/2018 08:31 AM   Modules accepted: Orders

## 2018-07-07 LAB — PATHOLOGIST SMEAR REVIEW

## 2018-07-07 LAB — CBC WITH DIFFERENTIAL/PLATELET
ABSOLUTE MONOCYTES: 745 {cells}/uL (ref 200–950)
BASOS ABS: 62 {cells}/uL (ref 0–200)
BASOS PCT: 1.1 %
EOS ABS: 409 {cells}/uL (ref 15–500)
Eosinophils Relative: 7.3 %
HEMATOCRIT: 38.8 % (ref 35.0–45.0)
Hemoglobin: 12.7 g/dL (ref 11.7–15.5)
LYMPHS ABS: 2828 {cells}/uL (ref 850–3900)
MCH: 30 pg (ref 27.0–33.0)
MCHC: 32.7 g/dL (ref 32.0–36.0)
MCV: 91.7 fL (ref 80.0–100.0)
MPV: 11.7 fL (ref 7.5–12.5)
Monocytes Relative: 13.3 %
NEUTROS ABS: 1557 {cells}/uL (ref 1500–7800)
Neutrophils Relative %: 27.8 %
Platelets: 241 10*3/uL (ref 140–400)
RBC: 4.23 10*6/uL (ref 3.80–5.10)
RDW: 12 % (ref 11.0–15.0)
Total Lymphocyte: 50.5 %
WBC: 5.6 10*3/uL (ref 3.8–10.8)

## 2018-07-24 DIAGNOSIS — D225 Melanocytic nevi of trunk: Secondary | ICD-10-CM | POA: Diagnosis not present

## 2018-07-24 DIAGNOSIS — D2262 Melanocytic nevi of left upper limb, including shoulder: Secondary | ICD-10-CM | POA: Diagnosis not present

## 2018-07-24 DIAGNOSIS — Z85828 Personal history of other malignant neoplasm of skin: Secondary | ICD-10-CM | POA: Diagnosis not present

## 2018-07-24 DIAGNOSIS — D2261 Melanocytic nevi of right upper limb, including shoulder: Secondary | ICD-10-CM | POA: Diagnosis not present

## 2018-10-28 DIAGNOSIS — K5900 Constipation, unspecified: Secondary | ICD-10-CM | POA: Diagnosis not present

## 2018-10-28 DIAGNOSIS — R140 Abdominal distension (gaseous): Secondary | ICD-10-CM | POA: Diagnosis not present

## 2018-10-28 DIAGNOSIS — K581 Irritable bowel syndrome with constipation: Secondary | ICD-10-CM | POA: Diagnosis not present

## 2018-11-26 ENCOUNTER — Other Ambulatory Visit: Payer: BLUE CROSS/BLUE SHIELD

## 2018-12-02 ENCOUNTER — Other Ambulatory Visit: Payer: Self-pay | Admitting: Oncology

## 2018-12-02 ENCOUNTER — Ambulatory Visit
Admission: RE | Admit: 2018-12-02 | Discharge: 2018-12-02 | Disposition: A | Discharge status: Home / Self Care | Payer: BLUE CROSS/BLUE SHIELD | Source: Ambulatory Visit | Attending: Oncology | Admitting: Oncology

## 2018-12-02 ENCOUNTER — Other Ambulatory Visit: Payer: Self-pay

## 2018-12-02 DIAGNOSIS — C50911 Malignant neoplasm of unspecified site of right female breast: Secondary | ICD-10-CM | POA: Diagnosis not present

## 2018-12-02 DIAGNOSIS — Z170 Estrogen receptor positive status [ER+]: Secondary | ICD-10-CM | POA: Insufficient documentation

## 2018-12-02 DIAGNOSIS — Z1231 Encounter for screening mammogram for malignant neoplasm of breast: Secondary | ICD-10-CM | POA: Insufficient documentation

## 2018-12-08 ENCOUNTER — Telehealth: Payer: Self-pay | Admitting: *Deleted

## 2018-12-08 ENCOUNTER — Encounter: Payer: Self-pay | Admitting: General Surgery

## 2018-12-08 ENCOUNTER — Ambulatory Visit: Payer: BC Managed Care – PPO | Admitting: General Surgery

## 2018-12-08 ENCOUNTER — Other Ambulatory Visit: Payer: Self-pay

## 2018-12-08 VITALS — BP 126/74 | HR 67 | Temp 97.8°F | Ht 66.0 in | Wt 139.0 lb

## 2018-12-08 DIAGNOSIS — Z853 Personal history of malignant neoplasm of breast: Secondary | ICD-10-CM | POA: Diagnosis not present

## 2018-12-08 NOTE — Patient Instructions (Addendum)
  The patient has been asked to return to her PCP for screening mammogram. Do self breast check monthly. . The patient is aware to call back for any questions or concerns.

## 2018-12-08 NOTE — Progress Notes (Signed)
Patient ID: Cynthia Willis, female   DOB: 02/09/1966, 53 y.o.   MRN: [id number]  Chief Complaint  Patient presents with  . Follow-up    mammogram     HPI Cynthia Willis is a 53 y.o. female who presents for a breast evaluation. The most recent mammogram was done on 11/26/2018.  Patient does perform regular self breast checks and gets regular mammograms done.    HPI  Past Medical History:  Diagnosis Date  . Basal cell carcinoma 05/2009   upper chest; Dr. [name]  . Breast cancer ([place])   . Cancer [facility]) 2013   November 05, 2011 wide excision and sentinel node biopsy for invasive ductal carcinoma involving the right breast.  . Carcinoma of right breast ([place]) 12/28/2014  . Chicken pox   . Colon polyps 2013   hyperplastic polyps x1  . Cystitis   . Genetic screening 01/30/2017   Invitae, variant of uncertain significant DICER1  . Hemorrhoids   . History of Papanicolaou smear of cervix 08/09/10; 11/03/13   -/-; -/-  . Kidney stones   . Malignant neoplasm of upper-outer quadrant of female breast ([place]) 11/05/2011   T1C, N0, M0; ER 90%, PR 90%, HER-2/neu not over expressed. Oncotype DX score 14: low risk of recurrence with antiestrogen alone at 8%.  . Menorrhagia   . Migraines   . Personal history of malignant neoplasm of breast 2013   BRCA negative, May 2013  . Personal history of radiation therapy 2013   right breast cancer    Past Surgical History:  Procedure Laterality Date  . basal cell skin excision  05/2009   Dr. [name]  . BREAST BIOPSY Right 10/2011   bx +   . BREAST LUMPECTOMY Right 11/05/2011   November 05, 2011 wide excision and sentinel node biopsy for invasive ductal carcinoma involving the right breast.  . BREAST SURGERY Right 2013   Wide excision, mastoplasty, sentinel node biopsy.  . COLONOSCOPY  2013   hyperplastic polyp identified in the sigmoid  04/21/2012  , Dr. [name]  . DILATION AND CURETTAGE OF UTERUS  2001  . LAPAROSCOPY  11/09/2013   iud removal  . LASIK   2013  . OOPHORECTOMY Bilateral Nov 09 2013   Dr. [name]    Family History  Problem Relation Age of Onset  . Colon cancer Father 26        48, 37, 2, 71, currently has at age 65  . Heart disease Father        mini stroke  . Other Father        lynch syndrome Dx at age 67 ?  [name] Cancer Maternal Aunt 35       breast; ovary  . Breast cancer Maternal Aunt   . Hyperlipidemia Mother   . Heart attack Mother   . Other Sister 10       lynch syndrome    Social History Social History   Tobacco Use  . Smoking status: Never Smoker  . Smokeless tobacco: Never Used  Substance Use Topics  . Alcohol use: No  . Drug use: No    No Known Allergies  Current Outpatient Medications  Medication Sig Dispense Refill  . aspirin 81 MG tablet Take 81 mg by mouth daily.    . Calcium-Magnesium-Vitamin D (CALCIUM MAGNESIUM PO) Take by mouth 2 (two) times daily.    . Cholecalciferol (VITAMIN D-3) 5000 UNITS TABS Take by mouth every morning.      No current  facility-administered medications for this visit.     Review of Systems Review of Systems  Constitutional: Negative.   Respiratory: Negative.   Cardiovascular: Negative.     Blood pressure 126/74, pulse 67, temperature 97.8 F (36.6 C), temperature source Skin, height '5\' 6"'  (1.676 m), weight 139 lb (63 kg), last menstrual period 10/26/2012, SpO2 98 %.  Physical Exam Physical Exam Constitutional:      Appearance: She is well-developed.  Eyes:     General: No scleral icterus.    Conjunctiva/sclera: Conjunctivae normal.  Neck:     Musculoskeletal: Neck supple.  Cardiovascular:     Rate and Rhythm: Normal rate and regular rhythm.     Heart sounds: Normal heart sounds.  Pulmonary:     Effort: Pulmonary effort is normal.     Breath sounds: Normal breath sounds.  Chest:     Breasts:        Right: Normal.        Left: Normal.    Lymphadenopathy:     Cervical: No cervical adenopathy.  Skin:    General: Skin is warm and dry.   Neurological:     Mental Status: She is alert and oriented to person, place, and time.     Data Reviewed Bilateral screening mammograms dated October 02, 2018 were independently reviewed.  Dense breast tissue bilaterally.  No interval change.  BI-RADS-1.  Assessment No evident recurrent disease now 7 years status post breast conservation.  Plan The patient was offered the opportunity to have her annual clinical exams and screening mammograms completed with her PCP.  She is amenable to our plan as long as her PCP is comfortable.  With her dense breast, she is encouraged to do self breast check monthly.   The patient is encouraged to call back for any questions or any new clinical or mammographic concerns.   HPI, Physical Exam, Assessment and Plan have been scribed under the direction and in the presence of Hervey Ard, MD.  Gaspar Cola, CMA  I have completed the exam and reviewed the above documentation for accuracy and completeness.  I agree with the above.  Haematologist has been used and any errors in dictation or transcription are unintentional.  Hervey Ard, M.D., F.A.C.S.  [name] 12/08/2018, 8:12 PM

## 2018-12-11 NOTE — Telephone Encounter (Signed)
Error

## 2019-01-15 ENCOUNTER — Other Ambulatory Visit: Payer: Self-pay

## 2019-01-17 NOTE — Progress Notes (Signed)
Cynthia Willis  [phone]  Fax:([phone]) [place] DOB: 10/21/1965  MR#: [id number]  IRS#:[id number]  Patient Care Team: Burnard Hawthorne, FNP as PCP - General (Family Medicine) Bary Castilla, Forest Gleason, MD as Consulting Physician (General Surgery)  CHIEF COMPLAINT: Pathologic stage Ia ER/PR positive, HER-2 negative invasive carcinoma of the right breast, unspecified site.  INTERVAL HISTORY: Patient returns to clinic today for routine yearly evaluation.  She continues to feel well and is asymptomatic. She has no neurologic complaints.  She denies any recent fevers or illnesses.  She has a good appetite and denies weight loss.  She denies any chest pain, shortness of breath, cough, or hemoptysis.  She denies any nausea, vomiting, constipation, or diarrhea. She has no urinary complaints.  Patient feels at her baseline offers no specific complaints today.  REVIEW OF SYSTEMS:   Review of Systems  Constitutional: Negative.  Negative for fever, malaise/fatigue and weight loss.  Respiratory: Negative.  Negative for cough and shortness of breath.   Cardiovascular: Negative.  Negative for chest pain and leg swelling.  Gastrointestinal: Negative.  Negative for abdominal pain and constipation.  Genitourinary: Negative.  Negative for dysuria.  Musculoskeletal: Negative.  Negative for myalgias.  Skin: Negative.  Negative for rash.  Neurological: Negative.  Negative for sensory change, focal weakness, weakness and headaches.  Psychiatric/Behavioral: Negative.  The patient is not nervous/anxious.     As per HPI. Otherwise, a complete review of systems is negative.  ONCOLOGY HISTORY: Oncology History Overview Note  Chief Complaint/Diagnosis:   52. 53 year old female status was wide local excision and sentinel node biopsy for a clinical stage I (T1 C. N0 M0) ER/PR positive HER-2/neu negative invasive mammary carcinoma.PATIENT HAS    FISH RATIO OF 1.67 status post  lumpectomy(April, 2013) Oncotype DX.  Lowest risk.  Score of 12 with 8-10% risk over   10 years for recurrent disease 2. Started on tamoxifen from August of 2013. 3. BRCA1 and BRCA2 no mutation identified (May of 2013) 4.Started on Lupron from December 23, 2012 tamoxifen was discontinued been patient was started aromasin 5.bilateral oophorectomy was done in May of 2015 Patient is in postmenopausal status and has been started on Aromasin   Breast cancer, right ([place])  10/27/2012 Initial Diagnosis   Breast cancer     PAST MEDICAL HISTORY: Past Medical History:  Diagnosis Date  . Basal cell carcinoma 05/2009   upper chest; Dr. [name]  . Breast cancer ([place])   . Cancer [facility]) 2013   November 05, 2011 wide excision and sentinel node biopsy for invasive ductal carcinoma involving the right breast.  . Carcinoma of right breast ([place]) 12/28/2014  . Chicken pox   . Colon polyps 2013   hyperplastic polyps x1  . Cystitis   . Genetic screening 01/30/2017   Invitae, variant of uncertain significant DICER1  . Hemorrhoids   . History of Papanicolaou smear of cervix 08/09/10; 11/03/13   -/-; -/-  . Kidney stones   . Malignant neoplasm of upper-outer quadrant of female breast ([place]) 11/05/2011   T1C, N0, M0; ER 90%, PR 90%, HER-2/neu not over expressed. Oncotype DX score 14: low risk of recurrence with antiestrogen alone at 8%.  . Menorrhagia   . Migraines   . Personal history of malignant neoplasm of breast 2013   BRCA negative, May 2013  . Personal history of radiation therapy 2013   right breast cancer    PAST SURGICAL HISTORY: Past Surgical History:  Procedure  Laterality Date  . basal cell skin excision  05/2009   Dr. [name]  . BREAST BIOPSY Right 10/2011   bx +   . BREAST LUMPECTOMY Right 11/05/2011   November 05, 2011 wide excision and sentinel node biopsy for invasive ductal carcinoma involving the right breast.  . BREAST SURGERY Right 2013   Wide excision, mastoplasty, sentinel node  biopsy.  . COLONOSCOPY  2013   hyperplastic polyp identified in the sigmoid  04/21/2012  , Dr. [name]  . DILATION AND CURETTAGE OF UTERUS  2001  . LAPAROSCOPY  11/09/2013   iud removal  . LASIK  2013  . OOPHORECTOMY Bilateral Nov 09 2013   Dr. [name]    FAMILY HISTORY Family History  Problem Relation Age of Onset  . Colon cancer Father 73        48, 39, 80, 74, currently has at age 74  . Heart disease Father        mini stroke  . Other Father        lynch syndrome Dx at age 55 ?  [name] Cancer Maternal Aunt 35       breast; ovary  . Breast cancer Maternal Aunt   . Hyperlipidemia Mother   . Heart attack Mother   . Other Sister 50       lynch syndrome    GYNECOLOGIC HISTORY:  Patient's last menstrual period was 10/26/2012.     ADVANCED DIRECTIVES:    HEALTH MAINTENANCE: Social History   Tobacco Use  . Smoking status: Never Smoker  . Smokeless tobacco: Never Used  Substance Use Topics  . Alcohol use: No  . Drug use: No     Colonoscopy:  PAP:  Bone density: 12/2015  Mammogram: 11/2015  No Known Allergies  Current Outpatient Medications  Medication Sig Dispense Refill  . aspirin 81 MG tablet Take 81 mg by mouth daily.    . Calcium-Magnesium-Vitamin D (CALCIUM MAGNESIUM PO) Take by mouth 2 (two) times daily.     No current facility-administered medications for this visit.     OBJECTIVE: BP 120/80 (BP Location: Left Arm, Patient Position: Sitting)   Pulse 80   Temp (!) 97.1 F (36.2 C) (Tympanic)   Ht '5\' 6"'  (1.676 m)   Wt 138 lb 11.2 oz (62.9 kg)   LMP 10/26/2012   BMI 22.39 kg/m    Body mass index is 22.39 kg/m.    ECOG FS:0 - Asymptomatic  General: Well-developed, well-nourished, no acute distress. Eyes: Pink conjunctiva, anicteric sclera. HEENT: Normocephalic, moist mucous membranes. Breast: Patient declined breast exam today. Lungs: Clear to auscultation bilaterally. Heart: Regular rate and rhythm. No rubs, murmurs, or gallops. Abdomen: Soft,  nontender, nondistended. No organomegaly noted, normoactive bowel sounds. Musculoskeletal: No edema, cyanosis, or clubbing. Neuro: Alert, answering all questions appropriately. Cranial nerves grossly intact. Skin: No rashes or petechiae noted. Psych: Normal affect.  LAB RESULTS: CBC and MetC from [facility] on July 10, 2016 reported within normal limits. CA-27-29 9.2.  STUDIES: No results found.  ASSESSMENT: Pathologic stage Ia ER/PR positive, HER-2 negative invasive carcinoma of the right breast, unspecified site.  PLAN:   1. Pathologic stage Ia ER/PR positive, HER-2 negative invasive carcinoma of the right breast, unspecified site: Patient is status post lumpectomy, radiation therapy. Oncotype DX score of 12, low risk. She initially started on tamoxifen in August 2013. Patient had a bilateral oophorectomy performed in May 2015 and was switched to Aromasin and completed 5 years of treatment in August 2018. Given  the low risk Oncotype and low stage of disease, there was likely no benefit and extending endocrine therapy for 7 or 10 years.  Her most recent mammogram on Dec 02, 2018 was reported as BI-RADS 1.  Repeat in May 2021.  After lengthy discussion with the patient, it was decided that no further follow-up is necessary.  She agreed to have her primary care physician perform yearly breast exams and order mammograms.  Please refer patient back if there are any questions or concerns.      2. Osteopenia: Bone mineral density completed on December 22, 2015 reported T score of -1.9. Continue monitoring per primary care. Continue calcium and vitamin D supplementation.  I spent a total of 20 minutes face-to-face with the patient of which greater than 50% of the visit was spent in counseling and coordination of care as detailed above.   Patient expressed understanding and was in agreement with this plan. She also understands that She can call clinic at any time with any questions, concerns, or complaints.    Breast cancer [facility])   Staging form: Breast, AJCC 7th Edition     Clinical: Stage IA (T1c, N0, M0) - Unsigned   Lloyd Huger, MD   01/20/2019 7:11 AM

## 2019-01-18 ENCOUNTER — Other Ambulatory Visit: Payer: Self-pay

## 2019-01-18 ENCOUNTER — Encounter: Payer: Self-pay | Admitting: Oncology

## 2019-01-18 ENCOUNTER — Inpatient Hospital Stay: Payer: BC Managed Care – PPO | Attending: Oncology | Admitting: Oncology

## 2019-01-18 VITALS — BP 120/80 | HR 80 | Temp 97.1°F | Ht 66.0 in | Wt 138.7 lb

## 2019-01-18 DIAGNOSIS — Z170 Estrogen receptor positive status [ER+]: Secondary | ICD-10-CM

## 2019-01-18 DIAGNOSIS — Z800 Family history of malignant neoplasm of digestive organs: Secondary | ICD-10-CM | POA: Insufficient documentation

## 2019-01-18 DIAGNOSIS — M8580 Other specified disorders of bone density and structure, unspecified site: Secondary | ICD-10-CM | POA: Diagnosis not present

## 2019-01-18 DIAGNOSIS — Z853 Personal history of malignant neoplasm of breast: Secondary | ICD-10-CM | POA: Diagnosis not present

## 2019-01-18 DIAGNOSIS — C50911 Malignant neoplasm of unspecified site of right female breast: Secondary | ICD-10-CM

## 2019-01-18 NOTE — Progress Notes (Signed)
Patient stated that she had been doing well with no complaints. Patient's last mammogram was on 12/02/2018 and it was benign.

## 2019-01-20 ENCOUNTER — Ambulatory Visit: Payer: BLUE CROSS/BLUE SHIELD | Admitting: Oncology

## 2019-02-03 DIAGNOSIS — F411 Generalized anxiety disorder: Secondary | ICD-10-CM | POA: Diagnosis not present

## 2019-02-11 DIAGNOSIS — F411 Generalized anxiety disorder: Secondary | ICD-10-CM | POA: Diagnosis not present

## 2019-02-24 DIAGNOSIS — F411 Generalized anxiety disorder: Secondary | ICD-10-CM | POA: Diagnosis not present

## 2019-02-28 DIAGNOSIS — Z20828 Contact with and (suspected) exposure to other viral communicable diseases: Secondary | ICD-10-CM | POA: Diagnosis not present

## 2019-03-03 DIAGNOSIS — F411 Generalized anxiety disorder: Secondary | ICD-10-CM | POA: Diagnosis not present

## 2019-03-09 DIAGNOSIS — F411 Generalized anxiety disorder: Secondary | ICD-10-CM | POA: Diagnosis not present

## 2019-03-17 DIAGNOSIS — F411 Generalized anxiety disorder: Secondary | ICD-10-CM | POA: Diagnosis not present

## 2019-03-31 DIAGNOSIS — F411 Generalized anxiety disorder: Secondary | ICD-10-CM | POA: Diagnosis not present

## 2019-06-16 ENCOUNTER — Telehealth (HOSPITAL_COMMUNITY): Payer: Self-pay | Admitting: Emergency Medicine

## 2019-06-16 NOTE — Telephone Encounter (Signed)
Record review

## 2019-06-21 ENCOUNTER — Other Ambulatory Visit: Payer: Self-pay

## 2019-06-21 ENCOUNTER — Ambulatory Visit
Admission: EM | Admit: 2019-06-21 | Discharge: 2019-06-21 | Disposition: A | Discharge status: Home / Self Care | Payer: BC Managed Care – PPO | Attending: Emergency Medicine | Admitting: Emergency Medicine

## 2019-06-21 DIAGNOSIS — Z0189 Encounter for other specified special examinations: Secondary | ICD-10-CM

## 2019-06-21 DIAGNOSIS — Z20828 Contact with and (suspected) exposure to other viral communicable diseases: Secondary | ICD-10-CM

## 2019-06-21 NOTE — ED Provider Notes (Signed)
Cynthia Willis    CSN: [id number] Arrival date & time: 06/21/19  1121      History   Chief Complaint Chief Complaint  Patient presents with  . Covid test    HPI Cynthia Willis is a 53 y.o. female.   Patient presents with request for COVID test.  She states her mother is currently hospitalized with COVID.  Patient is asymptomatic and denies fever, chills, congestion, cough, shortness of breath, vomiting, diarrhea, or other concerns.  No treatments attempted at home.  The history is provided by the patient.    Past Medical History:  Diagnosis Date  . Basal cell carcinoma 05/2009   upper chest; Dr. [name]  . Breast cancer ([place])   . Cancer [facility]) 2013   November 05, 2011 wide excision and sentinel node biopsy for invasive ductal carcinoma involving the right breast.  . Carcinoma of right breast ([place]) 12/28/2014  . Chicken pox   . Colon polyps 2013   hyperplastic polyps x1  . Cystitis   . Genetic screening 01/30/2017   Invitae, variant of uncertain significant DICER1  . Hemorrhoids   . History of Papanicolaou smear of cervix 08/09/10; 11/03/13   -/-; -/-  . Kidney stones   . Malignant neoplasm of upper-outer quadrant of female breast ([place]) 11/05/2011   T1C, N0, M0; ER 90%, PR 90%, HER-2/neu not over expressed. Oncotype DX score 14: low risk of recurrence with antiestrogen alone at 8%.  . Menorrhagia   . Migraines   . Personal history of malignant neoplasm of breast 2013   BRCA negative, May 2013  . Personal history of radiation therapy 2013   right breast cancer    Patient Active Problem List   Diagnosis Date Noted  . Generalized anxiety disorder 06/19/2016  . Routine physical examination 04/10/2016  . Family history of Lynch syndrome 04/10/2016  . Acute left-sided low back pain with bilateral sciatica 04/10/2016  . Breast cancer, right ([place]) 10/27/2012    Past Surgical History:  Procedure Laterality Date  . basal cell skin excision  05/2009   Dr. [name]    . BREAST BIOPSY Right 10/2011   bx +   . BREAST LUMPECTOMY Right 11/05/2011   November 05, 2011 wide excision and sentinel node biopsy for invasive ductal carcinoma involving the right breast.  . BREAST SURGERY Right 2013   Wide excision, mastoplasty, sentinel node biopsy.  . COLONOSCOPY  2013   hyperplastic polyp identified in the sigmoid  04/21/2012  , Dr. [name]  . DILATION AND CURETTAGE OF UTERUS  2001  . LAPAROSCOPY  11/09/2013   iud removal  . LASIK  2013  . OOPHORECTOMY Bilateral Nov 09 2013   Dr. [name]    OB History    Gravida  2   Para  2   Term  2   Preterm      AB      Living  2     SAB      TAB      Ectopic      Multiple      Live Births  2        Obstetric Comments   First Pregnancy 22  Fisrt menstrual 14         Home Medications    Prior to Admission medications   Medication Sig Start Date End Date Taking? Authorizing Provider  aspirin 81 MG tablet Take 81 mg by mouth daily.    [provider]  Calcium-Magnesium-Vitamin  D (CALCIUM MAGNESIUM PO) Take by mouth 2 (two) times daily.    [provider]    Family History Family History  Problem Relation Age of Onset  . Colon cancer Father 88        48, 54, 81, 76, currently has at age 66  . Heart disease Father        mini stroke  . Other Father        lynch syndrome Dx at age 66 ?  [name] Cancer Maternal Aunt 35       breast; ovary  . Breast cancer Maternal Aunt   . Hyperlipidemia Mother   . Heart attack Mother   . Other Sister 79       lynch syndrome    Social History Social History   Tobacco Use  . Smoking status: Never Smoker  . Smokeless tobacco: Never Used  Substance Use Topics  . Alcohol use: No  . Drug use: No     Allergies   Patient has no known allergies.   Review of Systems Review of Systems  Constitutional: Negative for chills and fever.  HENT: Negative for congestion, ear pain, rhinorrhea and sore throat.   Eyes: Negative for pain  and visual disturbance.  Respiratory: Negative for cough and shortness of breath.   Cardiovascular: Negative for chest pain and palpitations.  Gastrointestinal: Negative for abdominal pain, diarrhea and vomiting.  Genitourinary: Negative for dysuria and hematuria.  Musculoskeletal: Negative for arthralgias and back pain.  Skin: Negative for color change and rash.  Neurological: Negative for seizures and syncope.  All other systems reviewed and are negative.    Physical Exam Triage Vital Signs ED Triage Vitals  Enc Vitals Group     BP 06/21/19 1129 117/77     Pulse Rate 06/21/19 1129 66     Resp 06/21/19 1129 15     Temp 06/21/19 1129 98.3 F (36.8 C)     Temp Source 06/21/19 1129 Temporal     SpO2 06/21/19 1129 97 %     Weight --      Height --      Head Circumference --      Peak Flow --      Pain Score 06/21/19 1128 0     Pain Loc --      Pain Edu? --      Excl. in [place]? --    No data found.  Updated Vital Signs BP 117/77 (BP Location: Left Arm)   Pulse 66   Temp 98.3 F (36.8 C) (Temporal)   Resp 15   LMP 10/26/2012   SpO2 97%   Visual Acuity Right Eye Distance:   Left Eye Distance:   Bilateral Distance:    Right Eye Near:   Left Eye Near:    Bilateral Near:     Physical Exam Vitals and nursing note reviewed.  Constitutional:      General: She is not in acute distress.    Appearance: She is well-developed. She is not ill-appearing.  HENT:     Head: Normocephalic and atraumatic.     Right Ear: Tympanic membrane normal.     Left Ear: Tympanic membrane normal.     Nose: Nose normal.     Mouth/Throat:     Mouth: Mucous membranes are moist.     Pharynx: Oropharynx is clear.  Eyes:     Conjunctiva/sclera: Conjunctivae normal.  Cardiovascular:     Rate and Rhythm: Normal rate and regular rhythm.  Heart sounds: No murmur.  Pulmonary:     Effort: Pulmonary effort is normal. No respiratory distress.     Breath sounds: Normal breath sounds.   Abdominal:     General: Bowel sounds are normal.     Palpations: Abdomen is soft.     Tenderness: There is no abdominal tenderness. There is no guarding or rebound.  Musculoskeletal:     Cervical back: Neck supple.  Skin:    General: Skin is warm and dry.     Findings: No rash.  Neurological:     General: No focal deficit present.     Mental Status: She is alert and oriented to person, place, and time.     Gait: Gait normal.  Psychiatric:        Mood and Affect: Mood normal.        Behavior: Behavior normal.      UC Treatments / Results  Labs (all labs ordered are listed, but only abnormal results are displayed) Labs Reviewed  NOVEL CORONAVIRUS, NAA    EKG   Radiology No results found.  Procedures Procedures (including critical care time)  Medications Ordered in UC Medications - No data to display  Initial Impression / Assessment and Plan / UC Course  I have reviewed the triage vital signs and the nursing notes.  Pertinent labs & imaging results that were available during my care of the patient were reviewed by me and considered in my medical decision making (see chart for details).    Patient request for a COVID test.  Patient is well-appearing and asymptomatic.  COVID test performed here.  Instructed patient to self quarantine until the test result is back.  Instructed patient to go to the emergency department if she develops high fever, shortness of breath, severe diarrhea, or other concerning symptoms.  Patient agrees with plan of care.     Final Clinical Impressions(s) / UC Diagnoses   Final diagnoses:  Patient request for diagnostic testing     Discharge Instructions     Your COVID test is pending.  You should self quarantine until your test result is back and is negative.    Go to the emergency department if you develop high fever, shortness of breath, severe diarrhea, or other concerning symptoms.       ED Prescriptions    None     PDMP  not reviewed this encounter.   Sharion Balloon, NP 06/21/19 1150

## 2019-06-21 NOTE — ED Triage Notes (Signed)
Pt presents to the UC for Covid test. Pt states he mother teste positive for Covid, 5 days ago was her last exposure to her mother. Pt denies and sign and symptoms.

## 2019-06-21 NOTE — Discharge Instructions (Addendum)
Your COVID test is pending.  You should self quarantine until your test result is back and is negative.   ° °Go to the emergency department if you develop high fever, shortness of breath, severe diarrhea, or other concerning symptoms.   ° °

## 2019-06-23 LAB — NOVEL CORONAVIRUS, NAA: SARS-CoV-2, NAA: NOT DETECTED

## 2019-07-23 DIAGNOSIS — D2272 Melanocytic nevi of left lower limb, including hip: Secondary | ICD-10-CM | POA: Diagnosis not present

## 2019-07-23 DIAGNOSIS — D2261 Melanocytic nevi of right upper limb, including shoulder: Secondary | ICD-10-CM | POA: Diagnosis not present

## 2019-07-23 DIAGNOSIS — D225 Melanocytic nevi of trunk: Secondary | ICD-10-CM | POA: Diagnosis not present

## 2019-07-23 DIAGNOSIS — L218 Other seborrheic dermatitis: Secondary | ICD-10-CM | POA: Diagnosis not present

## 2019-10-19 IMAGING — MG MM DIGITAL DIAGNOSTIC BILAT W/ TOMO W/ CAD
6 of 10 series · 6 of 26 positions shown · non-contrast
Comparison: Previous exam(s).

CLINICAL DATA: Status post right lumpectomy and radiation therapy
for breast cancer in 6662.

EXAM:
DIGITAL DIAGNOSTIC BILATERAL MAMMOGRAM WITH CAD AND TOMO

[R MLO]
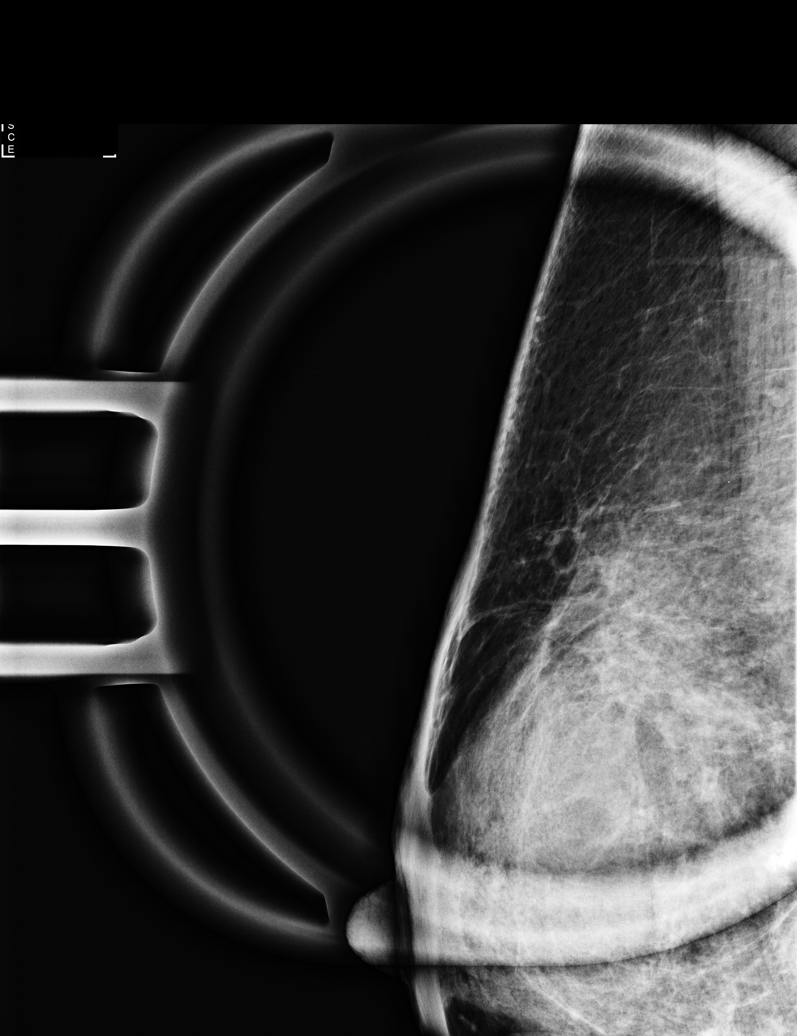

[R XCCL]
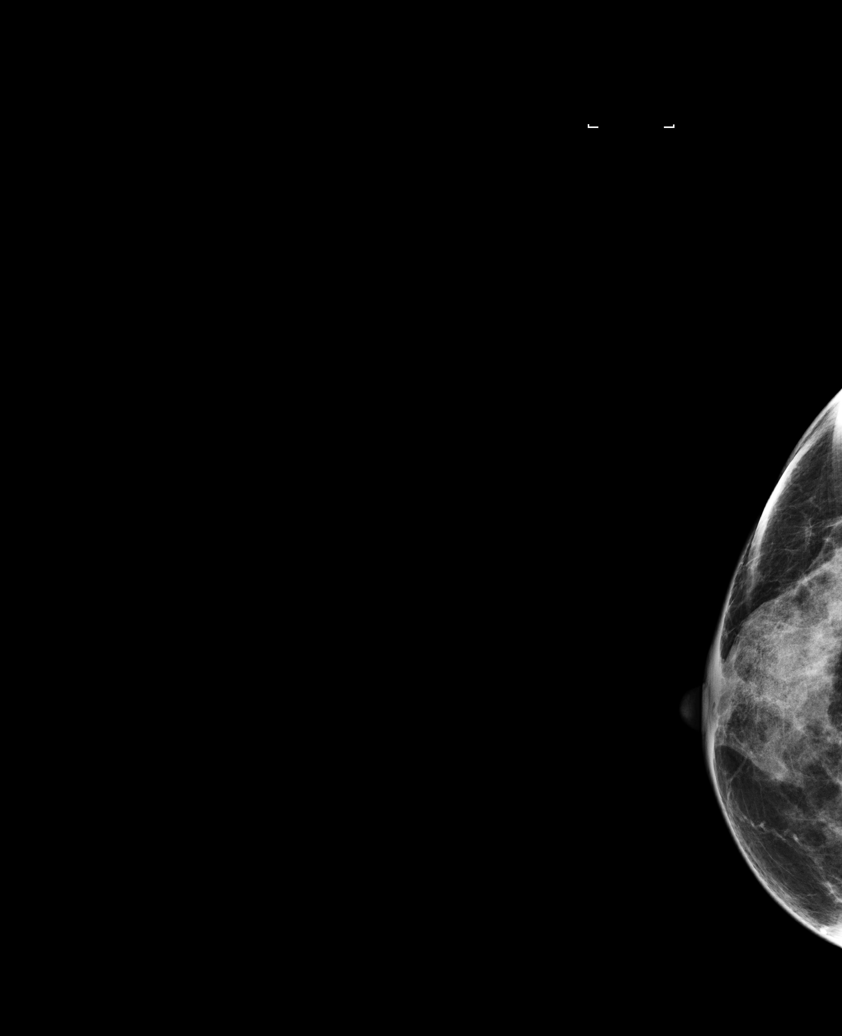

[L MLO synth-2D]
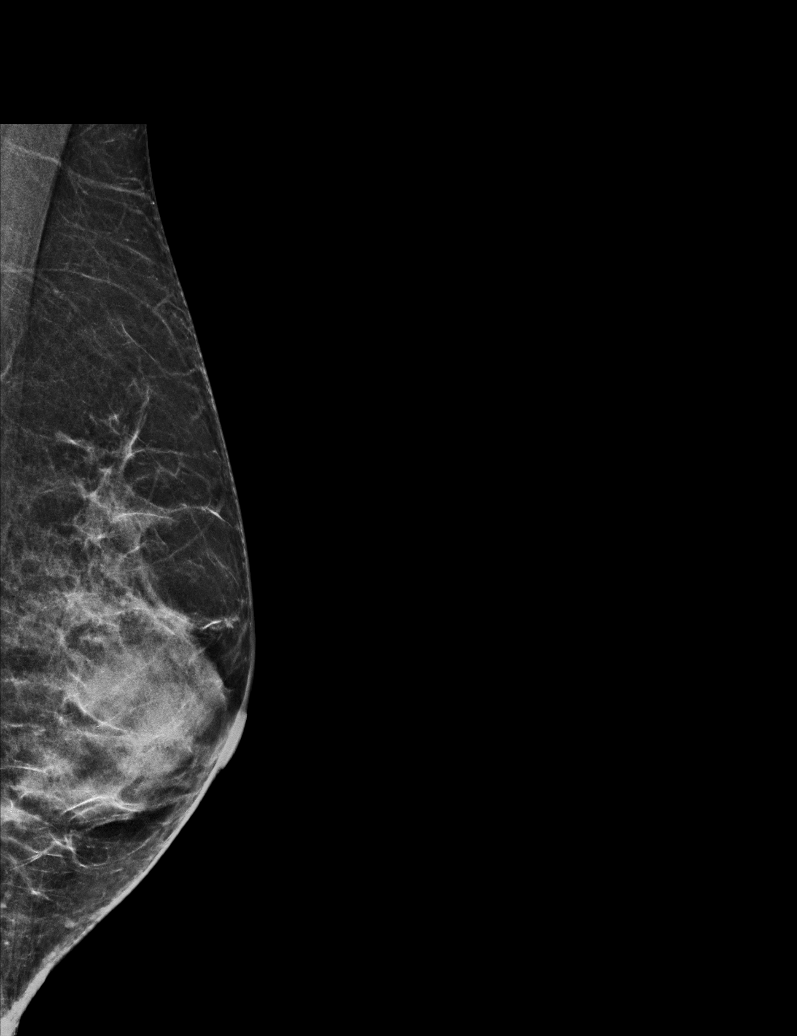

[R MLO synth-2D]
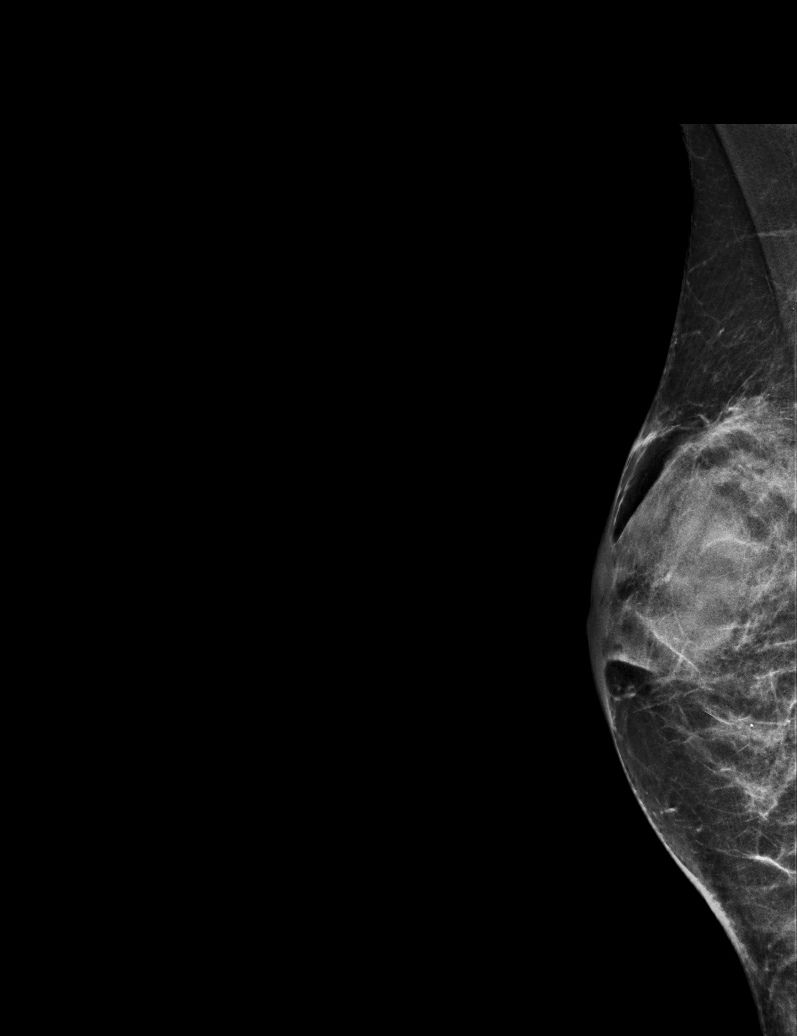

[R CC synth-2D]
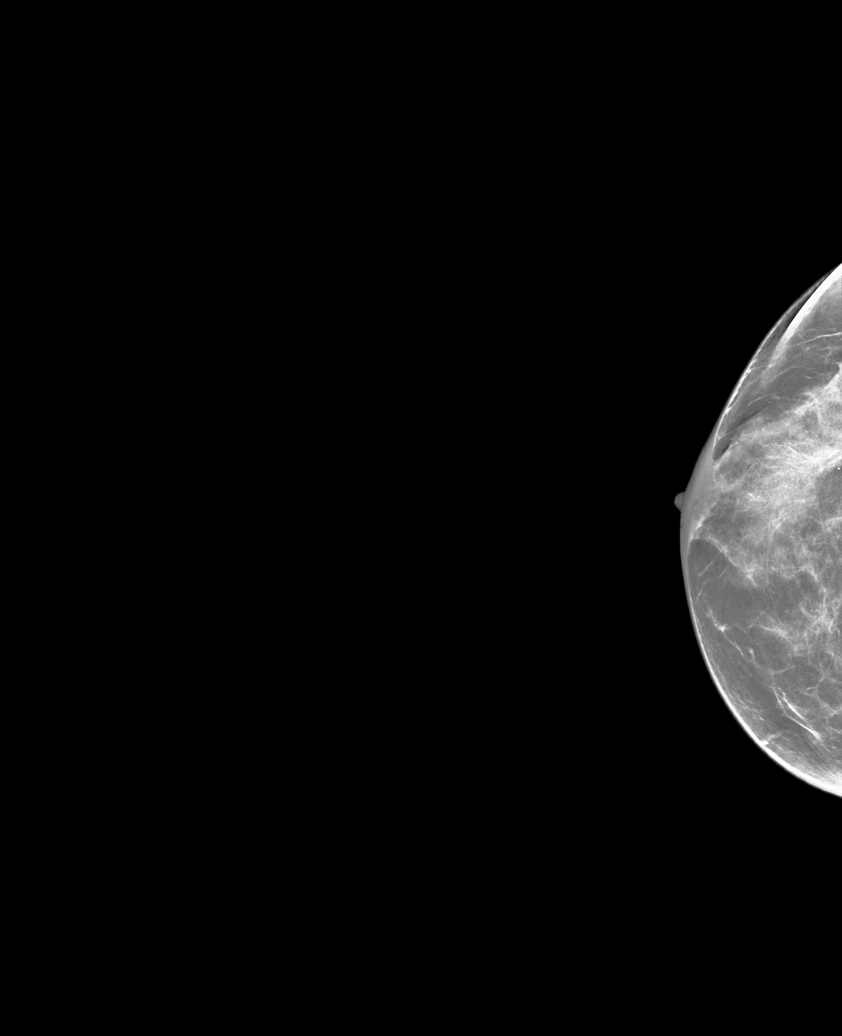

[L CC synth-2D]
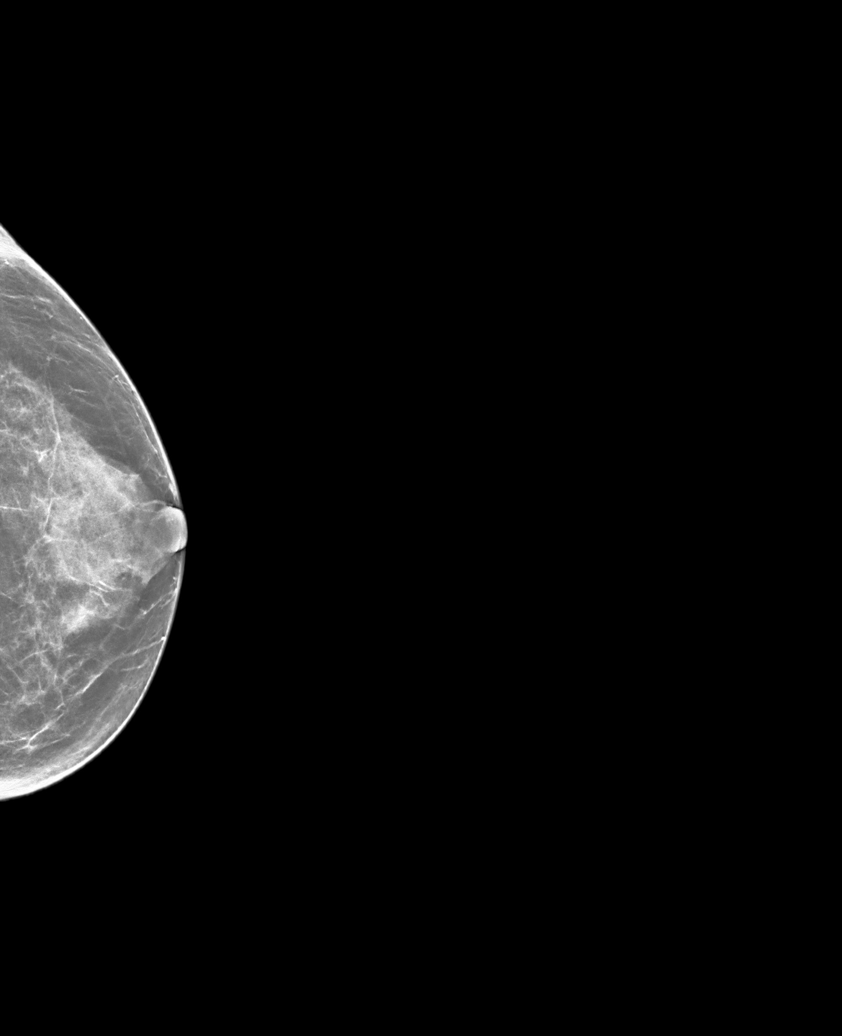

[6 of 26 positions shown; findings below may reference images not displayed]

ACR Breast Density Category d: The breast tissue is extremely dense,
which lowers the sensitivity of mammography.
FINDINGS: Stable post lumpectomy changes on the right with no interval
findings suspicious for malignancy in either breast.

Mammographic images were processed with CAD.
IMPRESSION: No evidence of malignancy.

RECOMMENDATION:
Bilateral screening mammogram in 1 year.

I have discussed the findings and recommendations with the patient.
Results were also provided in writing at the conclusion of the
visit. If applicable, a reminder letter will be sent to the patient
regarding the next appointment.

BI-RADS CATEGORY  2: Benign.

## 2019-11-05 ENCOUNTER — Other Ambulatory Visit: Payer: Self-pay

## 2019-11-08 ENCOUNTER — Encounter: Payer: Self-pay | Admitting: Family

## 2019-11-08 ENCOUNTER — Ambulatory Visit: Payer: BC Managed Care – PPO | Admitting: Family

## 2019-11-08 ENCOUNTER — Other Ambulatory Visit: Payer: Self-pay

## 2019-11-08 VITALS — BP 110/80 | HR 66 | Temp 97.0°F | Ht 65.0 in | Wt 138.2 lb

## 2019-11-08 DIAGNOSIS — Z0000 Encounter for general adult medical examination without abnormal findings: Secondary | ICD-10-CM

## 2019-11-08 DIAGNOSIS — C50911 Malignant neoplasm of unspecified site of right female breast: Secondary | ICD-10-CM | POA: Insufficient documentation

## 2019-11-08 DIAGNOSIS — Z23 Encounter for immunization: Secondary | ICD-10-CM

## 2019-11-08 LAB — CBC WITH DIFFERENTIAL/PLATELET
Basophils Absolute: 0.1 10*3/uL (ref 0.0–0.1)
Basophils Relative: 1.1 % (ref 0.0–3.0)
Eosinophils Absolute: 1 10*3/uL — ABNORMAL HIGH (ref 0.0–0.7)
Eosinophils Relative: 15.5 % — ABNORMAL HIGH (ref 0.0–5.0)
HCT: 37.6 % (ref 36.0–46.0)
Hemoglobin: 12.7 g/dL (ref 12.0–15.0)
Lymphocytes Relative: 43 % (ref 12.0–46.0)
Lymphs Abs: 2.8 10*3/uL (ref 0.7–4.0)
MCHC: 33.6 g/dL (ref 30.0–36.0)
MCV: 94.1 fl (ref 78.0–100.0)
Monocytes Absolute: 0.7 10*3/uL (ref 0.1–1.0)
Monocytes Relative: 10.2 % (ref 3.0–12.0)
Neutro Abs: 2 10*3/uL (ref 1.4–7.7)
Neutrophils Relative %: 30.2 % — ABNORMAL LOW (ref 43.0–77.0)
Platelets: 247 10*3/uL (ref 150.0–400.0)
RBC: 4 Mil/uL (ref 3.87–5.11)
RDW: 13.2 % (ref 11.5–15.5)
WBC: 6.6 10*3/uL (ref 4.0–10.5)

## 2019-11-08 LAB — LIPID PANEL
Cholesterol: 153 mg/dL (ref 0–200)
HDL: 50.1 mg/dL (ref >39.00)
LDL Cholesterol: 90 mg/dL (ref 0–99)
NonHDL: 102.58
Total CHOL/HDL Ratio: 3
Triglycerides: 64 mg/dL (ref 0.0–149.0)
VLDL: 12.8 mg/dL (ref 0.0–40.0)

## 2019-11-08 LAB — COMPREHENSIVE METABOLIC PANEL
ALT: 20 U/L (ref 0–35)
AST: 22 U/L (ref 0–37)
Albumin: 4.1 g/dL (ref 3.5–5.2)
Alkaline Phosphatase: 71 U/L (ref 39–117)
BUN: 15 mg/dL (ref 6–23)
CO2: 31 mEq/L (ref 19–32)
Calcium: 9.2 mg/dL (ref 8.4–10.5)
Chloride: 103 mEq/L (ref 96–112)
Creatinine, Ser: 0.58 mg/dL (ref 0.40–1.20)
GFR: 108.52 mL/min (ref >60.00)
Glucose, Bld: 96 mg/dL (ref 70–99)
Potassium: 3.8 mEq/L (ref 3.5–5.1)
Sodium: 139 mEq/L (ref 135–145)
Total Bilirubin: 0.3 mg/dL (ref 0.2–1.2)
Total Protein: 6.8 g/dL (ref 6.0–8.3)

## 2019-11-08 LAB — VITAMIN D 25 HYDROXY (VIT D DEFICIENCY, FRACTURES): VITD: 56.39 ng/mL (ref 30.00–100.00)

## 2019-11-08 LAB — HEMOGLOBIN A1C: Hgb A1c MFr Bld: 5.8 % (ref 4.6–6.5)

## 2019-11-08 LAB — TSH: TSH: 0.93 u[IU]/mL (ref 0.35–4.50)

## 2019-11-08 NOTE — Assessment & Plan Note (Signed)
Clinical breast exam performed today.  Assessed small left breast papule.  Discussed this at great length with patient today.  I advised pursue ultrasound of left breast and also diagnostic mammogram due to her history of breast cancer.  Patient politely declines and she really feels like it was a "pimple".  She would like to use warm compress at home will let me know if it is not completely resolved.  At this time I will order screening mammogram and patient understands schedule.  Deferred pelvic exam in the absence of complaints and she also follows with GYN.  Pap smear is up-to-date.

## 2019-11-08 NOTE — Patient Instructions (Signed)
Please call  and schedule your 3D mammogram, bone density scan as discussed.   [place]  Cortland Clawson, [place]  Stay safe!   Health Maintenance for Postmenopausal Women Menopause is a normal process in which your ability to get pregnant comes to an end. This process happens slowly over many months or years, usually between the ages of 57 and 61. Menopause is complete when you have missed your menstrual periods for 12 months. It is important to talk with your health care provider about some of the most common conditions that affect women after menopause (postmenopausal women). These include heart disease, cancer, and bone loss (osteoporosis). Adopting a healthy lifestyle and getting preventive care can help to promote your health and wellness. The actions you take can also lower your chances of developing some of these common conditions. What should I know about menopause? During menopause, you may get a number of symptoms, such as:  Hot flashes. These can be moderate or severe.  Night sweats.  Decrease in sex drive.  Mood swings.  Headaches.  Tiredness.  Irritability.  Memory problems.  Insomnia. Choosing to treat or not to treat these symptoms is a decision that you make with your health care provider. Do I need hormone replacement therapy?  Hormone replacement therapy is effective in treating symptoms that are caused by menopause, such as hot flashes and night sweats.  Hormone replacement carries certain risks, especially as you become older. If you are thinking about using estrogen or estrogen with progestin, discuss the benefits and risks with your health care provider. What is my risk for heart disease and stroke? The risk of heart disease, heart attack, and stroke increases as you age. One of the causes may be a change in the body's hormones during menopause. This can affect how your body uses dietary fats,  triglycerides, and cholesterol. Heart attack and stroke are medical emergencies. There are many things that you can do to help prevent heart disease and stroke. Watch your blood pressure  High blood pressure causes heart disease and increases the risk of stroke. This is more likely to develop in people who have high blood pressure readings, are of African descent, or are overweight.  Have your blood pressure checked: ? Every 3-5 years if you are 24-1 years of age. ? Every year if you are 43 years old or older. Eat a healthy diet   Eat a diet that includes plenty of vegetables, fruits, low-fat dairy products, and lean protein.  Do not eat a lot of foods that are high in solid fats, added sugars, or sodium. Get regular exercise Get regular exercise. This is one of the most important things you can do for your health. Most adults should:  Try to exercise for at least 150 minutes each week. The exercise should increase your heart rate and make you sweat (moderate-intensity exercise).  Try to do strengthening exercises at least twice each week. Do these in addition to the moderate-intensity exercise.  Spend less time sitting. Even light physical activity can be beneficial. Other tips  Work with your health care provider to achieve or maintain a healthy weight.  Do not use any products that contain nicotine or tobacco, such as cigarettes, e-cigarettes, and chewing tobacco. If you need help quitting, ask your health care provider.  Know your numbers. Ask your health care provider to check your cholesterol and your blood sugar (glucose). Continue to have your blood tested as  directed by your health care provider. Do I need screening for cancer? Depending on your health history and family history, you may need to have cancer screening at different stages of your life. This may include screening for:  Breast cancer.  Cervical cancer.  Lung cancer.  Colorectal cancer. What is my risk for  osteoporosis? After menopause, you may be at increased risk for osteoporosis. Osteoporosis is a condition in which bone destruction happens more quickly than new bone creation. To help prevent osteoporosis or the bone fractures that can happen because of osteoporosis, you may take the following actions:  If you are 66-7 years old, get at least 1,000 mg of calcium and at least 600 mg of vitamin D per day.  If you are older than age 61 but younger than age 99, get at least 1,200 mg of calcium and at least 600 mg of vitamin D per day.  If you are older than age 40, get at least 1,200 mg of calcium and at least 800 mg of vitamin D per day. Smoking and drinking excessive alcohol increase the risk of osteoporosis. Eat foods that are rich in calcium and vitamin D, and do weight-bearing exercises several times each week as directed by your health care provider. How does menopause affect my mental health? Depression may occur at any age, but it is more common as you become older. Common symptoms of depression include:  Low or sad mood.  Changes in sleep patterns.  Changes in appetite or eating patterns.  Feeling an overall lack of motivation or enjoyment of activities that you previously enjoyed.  Frequent crying spells. Talk with your health care provider if you think that you are experiencing depression. General instructions See your health care provider for regular wellness exams and vaccines. This may include:  Scheduling regular health, dental, and eye exams.  Getting and maintaining your vaccines. These include: ? Influenza vaccine. Get this vaccine each year before the flu season begins. ? Pneumonia vaccine. ? Shingles vaccine. ? Tetanus, diphtheria, and pertussis (Tdap) booster vaccine. Your health care provider may also recommend other immunizations. Tell your health care provider if you have ever been abused or do not feel safe at home. Summary  Menopause is a normal process in  which your ability to get pregnant comes to an end.  This condition causes hot flashes, night sweats, decreased interest in sex, mood swings, headaches, or lack of sleep.  Treatment for this condition may include hormone replacement therapy.  Take actions to keep yourself healthy, including exercising regularly, eating a healthy diet, watching your weight, and checking your blood pressure and blood sugar levels.  Get screened for cancer and depression. Make sure that you are up to date with all your vaccines. This information is not intended to replace advice given to you by your health care provider. Make sure you discuss any questions you have with your health care provider. Document Revised: 06/17/2018 Document Reviewed: 06/17/2018 Elsevier Patient Education  2020 [employer].

## 2019-11-08 NOTE — Progress Notes (Signed)
Subjective:    Patient ID: Cynthia Willis, female    DOB: [birth year], 54 y.o.   MRN: [id number]  CC: [name] is a 54 y.o. female who presents today for physical exam.    HPI: Feels well today, no concerns. Has a history of right breast cancer.  Recently had 'pimple' left side of left breast.  Was able to press out purulent discharge.  No tenderness, fever. No h/o boils, mrsa.   Colorectal Cancer Screening: UTD; repeat 2024  family h/o lynch syndrome    Breast Cancer Screening: History of right breast cancer.  Followed with Dr. Grayland Ormond, last visit July 2020, no further follow-up necessary.  She will follow with PCP for yearly breast exams and mammograms.  Cervical Cancer Screening: Follows with GYN, history of oophrectomy bilaterally. NO vaginal bleeding, pelvic pain.  Pap 2//27/2022.  Bone Health screening/DEXA for 65+: Due        Tetanus - due         Labs: Screening labs today. Exercise: Gets regular exercise, at least 5 days per week on elliptal.  Alcohol use: none Smoking/tobacco use: Nonsmoker.   Wears seat belt: Yes. History of basal cell carcinoma- follows with dermatology, Dr Darrick Huntsman.  HISTORY:  Past Medical History:  Diagnosis Date  . Basal cell carcinoma 05/2009   upper chest; Dr. [name]  . Breast cancer ([place])   . Cancer [facility]) 2013   November 05, 2011 wide excision and sentinel node biopsy for invasive ductal carcinoma involving the right breast.  . Carcinoma of right breast ([place]) 12/28/2014  . Chicken pox   . Colon polyps 2013   hyperplastic polyps x1  . Cystitis   . Genetic screening 01/30/2017   Invitae, variant of uncertain significant DICER1  . Hemorrhoids   . History of Papanicolaou smear of cervix 08/09/10; 11/03/13   -/-; -/-  . Kidney stones   . Malignant neoplasm of upper-outer quadrant of female breast ([place]) 11/05/2011   T1C, N0, M0; ER 90%, PR 90%, HER-2/neu not over expressed. Oncotype DX score 14: low risk of recurrence with  antiestrogen alone at 8%.  . Menorrhagia   . Migraines   . Personal history of malignant neoplasm of breast 2013   BRCA negative, May 2013  . Personal history of radiation therapy 2013   right breast cancer    Past Surgical History:  Procedure Laterality Date  . basal cell skin excision  05/2009   Dr. [name]  . BREAST BIOPSY Right 10/2011   bx +   . BREAST LUMPECTOMY Right 11/05/2011   November 05, 2011 wide excision and sentinel node biopsy for invasive ductal carcinoma involving the right breast.  . BREAST SURGERY Right 2013   Wide excision, mastoplasty, sentinel node biopsy.  . COLONOSCOPY  2013   hyperplastic polyp identified in the sigmoid  04/21/2012  , Dr. [name]  . DILATION AND CURETTAGE OF UTERUS  2001  . LAPAROSCOPY  11/09/2013   iud removal  . LASIK  2013  . OOPHORECTOMY Bilateral Nov 09 2013   Dr. [name]   Family History  Problem Relation Age of Onset  . Colon cancer Father 48        48, 60, 47, 80, currently has at age 44  . Heart disease Father        mini stroke  . Other Father        lynch syndrome Dx at age 73 ?  [name] Cancer Maternal Aunt 35  breast; ovary  . Breast cancer Maternal Aunt   . Hyperlipidemia Mother   . Heart attack Mother   . Other Sister 48       lynch syndrome      ALLERGIES: Patient has no known allergies.  Current Outpatient Medications on File Prior to Visit  Medication Sig Dispense Refill  . aspirin 81 MG tablet Take 81 mg by mouth daily.    . Calcium-Magnesium-Vitamin D (CALCIUM MAGNESIUM PO) Take by mouth 2 (two) times daily.     No current facility-administered medications on file prior to visit.    Social History   Tobacco Use  . Smoking status: Never Smoker  . Smokeless tobacco: Never Used  Substance Use Topics  . Alcohol use: No  . Drug use: No    Review of Systems  Constitutional: Negative for chills, fever and unexpected weight change.  HENT: Negative for congestion.   Respiratory: Negative for  cough.   Cardiovascular: Negative for chest pain, palpitations and leg swelling.  Gastrointestinal: Negative for nausea and vomiting.  Genitourinary: Negative for pelvic pain and vaginal bleeding.  Musculoskeletal: Negative for arthralgias and myalgias.  Skin: Negative for rash.  Neurological: Negative for headaches.  Hematological: Negative for adenopathy.  Psychiatric/Behavioral: Negative for confusion.      Objective:    BP 110/80   Pulse 66   Temp (!) 97 F (36.1 C) (Temporal)   Ht '5\' 5"'  (1.651 m)   Wt 138 lb 3.2 oz (62.7 kg)   LMP 10/26/2012   SpO2 98%   BMI 23.00 kg/m   BP Readings from Last 3 Encounters:  11/08/19 110/80  06/21/19 117/77  01/18/19 120/80   Wt Readings from Last 3 Encounters:  11/08/19 138 lb 3.2 oz (62.7 kg)  01/18/19 138 lb 11.2 oz (62.9 kg)  12/08/18 139 lb (63 kg)    Physical Exam Vitals reviewed.  Constitutional:      Appearance: She is well-developed.  Eyes:     Conjunctiva/sclera: Conjunctivae normal.  Neck:     Thyroid: No thyroid mass or thyromegaly.  Cardiovascular:     Rate and Rhythm: Normal rate and regular rhythm.     Pulses: Normal pulses.     Heart sounds: Normal heart sounds.  Pulmonary:     Effort: Pulmonary effort is normal.     Breath sounds: Normal breath sounds. No wheezing, rhonchi or rales.  Chest:     Breasts: Breasts are symmetrical.        Right: No inverted nipple, mass, nipple discharge, skin change or tenderness.        Left: No inverted nipple, mass, nipple discharge, skin change or tenderness.       Comments: <1cm papule noted. Non purulent. Nontender.  Lymphadenopathy:     Head:     Right side of head: No submental, submandibular, tonsillar, preauricular, posterior auricular or occipital adenopathy.     Left side of head: No submental, submandibular, tonsillar, preauricular, posterior auricular or occipital adenopathy.     Cervical: No cervical adenopathy.     Right cervical: No superficial, deep or  posterior cervical adenopathy.    Left cervical: No superficial, deep or posterior cervical adenopathy.  Skin:    General: Skin is warm and dry.  Neurological:     Mental Status: She is alert.  Psychiatric:        Speech: Speech normal.        Behavior: Behavior normal.        Thought Content: Thought  content normal.        Assessment & Plan:   Problem List Items Addressed This Visit      Other   Routine physical examination - Primary    Clinical breast exam performed today.  Assessed small left breast papule.  Discussed this at great length with patient today.  I advised pursue ultrasound of left breast and also diagnostic mammogram due to her history of breast cancer.  Patient politely declines and she really feels like it was a "pimple".  She would like to use warm compress at home will let me know if it is not completely resolved.  At this time I will order screening mammogram and patient understands schedule.  Deferred pelvic exam in the absence of complaints and she also follows with GYN.  Pap smear is up-to-date.      Relevant Orders   MM 3D SCREEN BREAST BILATERAL   DG Bone Density   TSH   CBC with Differential/Platelet   Comprehensive metabolic panel   Hemoglobin A1c   Lipid panel   VITAMIN D 25 Hydroxy (Vit-D Deficiency, Fractures)   SARS CoV2 Serology(COVID19) AB(IgG,IgM),Immunoassay    Other Visit Diagnoses    Need for Tdap vaccination       Relevant Orders   Tdap vaccine greater than or equal to 7yo IM (Completed)       I am having Cynthia Willis maintain her aspirin and Calcium-Magnesium-Vitamin D (CALCIUM MAGNESIUM PO).   No orders of the defined types were placed in this encounter.   Return precautions given.   Risks, benefits, and alternatives of the medications and treatment plan prescribed today were discussed, and patient expressed understanding.   Education regarding symptom management and diagnosis given to patient on AVS.   Continue to  follow with Burnard Hawthorne, FNP for routine health maintenance.   Cynthia Willis and I agreed with plan.   Mable Paris, FNP

## 2019-11-09 LAB — SARS COV-2 SEROLOGY(COVID-19)AB(IGG,IGM),IMMUNOASSAY
SARS CoV-2 AB IgG: NEGATIVE
SARS CoV-2 IgM: NEGATIVE

## 2019-11-10 ENCOUNTER — Other Ambulatory Visit: Payer: Self-pay | Admitting: Family

## 2019-11-10 DIAGNOSIS — R7989 Other specified abnormal findings of blood chemistry: Secondary | ICD-10-CM

## 2019-11-25 ENCOUNTER — Other Ambulatory Visit: Payer: Self-pay

## 2019-11-25 ENCOUNTER — Other Ambulatory Visit (INDEPENDENT_AMBULATORY_CARE_PROVIDER_SITE_OTHER): Payer: BC Managed Care – PPO

## 2019-11-25 DIAGNOSIS — R7989 Other specified abnormal findings of blood chemistry: Secondary | ICD-10-CM | POA: Diagnosis not present

## 2019-11-25 LAB — CBC WITH DIFFERENTIAL/PLATELET
Basophils Absolute: 0.1 10*3/uL (ref 0.0–0.1)
Basophils Relative: 1.4 % (ref 0.0–3.0)
Eosinophils Absolute: 1.1 10*3/uL — ABNORMAL HIGH (ref 0.0–0.7)
Eosinophils Relative: 16.7 % — ABNORMAL HIGH (ref 0.0–5.0)
HCT: 37.2 % (ref 36.0–46.0)
Hemoglobin: 12.4 g/dL (ref 12.0–15.0)
Lymphocytes Relative: 45.3 % (ref 12.0–46.0)
Lymphs Abs: 3 10*3/uL (ref 0.7–4.0)
MCHC: 33.5 g/dL (ref 30.0–36.0)
MCV: 94.1 fl (ref 78.0–100.0)
Monocytes Absolute: 0.7 10*3/uL (ref 0.1–1.0)
Monocytes Relative: 10.2 % (ref 3.0–12.0)
Neutro Abs: 1.8 10*3/uL (ref 1.4–7.7)
Neutrophils Relative %: 26.4 % — ABNORMAL LOW (ref 43.0–77.0)
Platelets: 247 10*3/uL (ref 150.0–400.0)
RBC: 3.95 Mil/uL (ref 3.87–5.11)
RDW: 13.3 % (ref 11.5–15.5)
WBC: 6.7 10*3/uL (ref 4.0–10.5)

## 2019-11-26 ENCOUNTER — Encounter: Payer: Self-pay | Admitting: Family

## 2019-11-29 ENCOUNTER — Other Ambulatory Visit: Payer: Self-pay | Admitting: Family

## 2019-11-29 DIAGNOSIS — R7989 Other specified abnormal findings of blood chemistry: Secondary | ICD-10-CM

## 2019-12-08 ENCOUNTER — Ambulatory Visit
Admission: RE | Admit: 2019-12-08 | Discharge: 2019-12-08 | Disposition: A | Discharge status: Home / Self Care | Payer: BC Managed Care – PPO | Source: Ambulatory Visit | Attending: Family | Admitting: Family

## 2019-12-08 DIAGNOSIS — Z0000 Encounter for general adult medical examination without abnormal findings: Secondary | ICD-10-CM

## 2019-12-08 DIAGNOSIS — M8589 Other specified disorders of bone density and structure, multiple sites: Secondary | ICD-10-CM | POA: Diagnosis not present

## 2019-12-08 DIAGNOSIS — Z1231 Encounter for screening mammogram for malignant neoplasm of breast: Secondary | ICD-10-CM | POA: Diagnosis not present

## 2020-01-31 ENCOUNTER — Other Ambulatory Visit (INDEPENDENT_AMBULATORY_CARE_PROVIDER_SITE_OTHER): Payer: BC Managed Care – PPO

## 2020-01-31 ENCOUNTER — Other Ambulatory Visit: Payer: Self-pay

## 2020-01-31 DIAGNOSIS — R7989 Other specified abnormal findings of blood chemistry: Secondary | ICD-10-CM | POA: Diagnosis not present

## 2020-01-31 LAB — CBC WITH DIFFERENTIAL/PLATELET
Basophils Absolute: 0.1 10*3/uL (ref 0.0–0.1)
Basophils Relative: 1 % (ref 0.0–3.0)
Eosinophils Absolute: 0.9 10*3/uL — ABNORMAL HIGH (ref 0.0–0.7)
Eosinophils Relative: 13.7 % — ABNORMAL HIGH (ref 0.0–5.0)
HCT: 36.7 % (ref 36.0–46.0)
Hemoglobin: 12.2 g/dL (ref 12.0–15.0)
Lymphocytes Relative: 48.5 % — ABNORMAL HIGH (ref 12.0–46.0)
Lymphs Abs: 3.3 10*3/uL (ref 0.7–4.0)
MCHC: 33.2 g/dL (ref 30.0–36.0)
MCV: 94.8 fl (ref 78.0–100.0)
Monocytes Absolute: 0.7 10*3/uL (ref 0.1–1.0)
Monocytes Relative: 10.2 % (ref 3.0–12.0)
Neutro Abs: 1.8 10*3/uL (ref 1.4–7.7)
Neutrophils Relative %: 26.6 % — ABNORMAL LOW (ref 43.0–77.0)
Platelets: 261 10*3/uL (ref 150.0–400.0)
RBC: 3.87 Mil/uL (ref 3.87–5.11)
RDW: 12.9 % (ref 11.5–15.5)
WBC: 6.9 10*3/uL (ref 4.0–10.5)

## 2020-02-04 ENCOUNTER — Telehealth: Payer: Self-pay | Admitting: Family

## 2020-02-04 DIAGNOSIS — D7219 Other eosinophilia: Secondary | ICD-10-CM

## 2020-02-04 NOTE — Telephone Encounter (Signed)
Call patient I Have placed referral to Dr. Grayland Ormond.  She may call his office to schedule follow-up

## 2020-02-04 NOTE — Telephone Encounter (Signed)
Pt needs a referral sent to Dr.Finnegan

## 2020-02-04 NOTE — Addendum Note (Signed)
Addended by: Burnard Hawthorne on: 02/04/2020 12:39 PM   Modules accepted: Orders

## 2020-02-08 NOTE — Telephone Encounter (Signed)
Patient verbalized understanding and had no further questions.  

## 2020-07-15 DIAGNOSIS — D721 Eosinophilia, unspecified: Secondary | ICD-10-CM | POA: Insufficient documentation

## 2020-07-15 NOTE — Progress Notes (Deleted)
[employer]  [phone]  Fax:([phone]) [place] DOB: Nov 21, 1965  MR#: [id number]  TKP#:[id number]  Patient Care Team: Burnard Hawthorne, FNP as PCP - General (Family Medicine) Bary Castilla, Forest Gleason, MD as Consulting Physician (General Surgery)  CHIEF COMPLAINT: Pathologic stage Ia ER/PR positive, HER-2 negative invasive carcinoma of the right breast, unspecified site.  INTERVAL HISTORY: Patient returns to clinic today for routine yearly evaluation.  She continues to feel well and is asymptomatic. She has no neurologic complaints.  She denies any recent fevers or illnesses.  She has a good appetite and denies weight loss.  She denies any chest pain, shortness of breath, cough, or hemoptysis.  She denies any nausea, vomiting, constipation, or diarrhea. She has no urinary complaints.  Patient feels at her baseline offers no specific complaints today.  REVIEW OF SYSTEMS:   Review of Systems  Constitutional: Negative.  Negative for fever, malaise/fatigue and weight loss.  Respiratory: Negative.  Negative for cough and shortness of breath.   Cardiovascular: Negative.  Negative for chest pain and leg swelling.  Gastrointestinal: Negative.  Negative for abdominal pain and constipation.  Genitourinary: Negative.  Negative for dysuria.  Musculoskeletal: Negative.  Negative for myalgias.  Skin: Negative.  Negative for rash.  Neurological: Negative.  Negative for sensory change, focal weakness, weakness and headaches.  Psychiatric/Behavioral: Negative.  The patient is not nervous/anxious.     As per HPI. Otherwise, a complete review of systems is negative.  ONCOLOGY HISTORY: Oncology History Overview Note  Chief Complaint/Diagnosis:   34. 55 year old female status was wide local excision and sentinel node biopsy for a clinical stage I (T1 C. N0 M0) ER/PR positive HER-2/neu negative invasive mammary carcinoma.PATIENT HAS    FISH RATIO OF 1.67 status post  lumpectomy(April, 2013) Oncotype DX.  Lowest risk.  Score of 12 with 8-10% risk over   10 years for recurrent disease 2. Started on tamoxifen from August of 2013. 3. BRCA1 and BRCA2 no mutation identified (May of 2013) 4.Started on Lupron from December 23, 2012 tamoxifen was discontinued been patient was started aromasin 5.bilateral oophorectomy was done in May of 2015 Patient is in postmenopausal status and has been started on Aromasin   Breast cancer, right ([place])  10/27/2012 Initial Diagnosis   Breast cancer     PAST MEDICAL HISTORY: Past Medical History:  Diagnosis Date  . Basal cell carcinoma 05/2009   upper chest; Dr. [name]  . Breast cancer ([place])   . Cancer [facility]) 2013   November 05, 2011 wide excision and sentinel node biopsy for invasive ductal carcinoma involving the right breast.  . Carcinoma of right breast ([place]) 12/28/2014  . Chicken pox   . Colon polyps 2013   hyperplastic polyps x1  . Cystitis   . Genetic screening 01/30/2017   Invitae, variant of uncertain significant DICER1  . Hemorrhoids   . History of Papanicolaou smear of cervix 08/09/10; 11/03/13   -/-; -/-  . Kidney stones   . Malignant neoplasm of upper-outer quadrant of female breast ([place]) 11/05/2011   T1C, N0, M0; ER 90%, PR 90%, HER-2/neu not over expressed. Oncotype DX score 14: low risk of recurrence with antiestrogen alone at 8%.  . Menorrhagia   . Migraines   . Personal history of malignant neoplasm of breast 2013   BRCA negative, May 2013  . Personal history of radiation therapy 2013   right breast cancer    PAST SURGICAL HISTORY: Past Surgical History:  Procedure  Laterality Date  . basal cell skin excision  05/2009   Dr. [name]  . BREAST BIOPSY Right 10/2011   bx +   . BREAST LUMPECTOMY Right 11/05/2011   November 05, 2011 wide excision and sentinel node biopsy for invasive ductal carcinoma involving the right breast.  . BREAST SURGERY Right 2013   Wide excision, mastoplasty, sentinel node  biopsy.  . COLONOSCOPY  2013   hyperplastic polyp identified in the sigmoid  04/21/2012  , Dr. [name]  . DILATION AND CURETTAGE OF UTERUS  2001  . LAPAROSCOPY  11/09/2013   iud removal  . LASIK  2013  . OOPHORECTOMY Bilateral Nov 09 2013   Dr. [name]    FAMILY HISTORY Family History  Problem Relation Age of Onset  . Colon cancer Father 23        48, 32, 65, 35, currently has at age 27  . Heart disease Father        mini stroke  . Other Father        lynch syndrome Dx at age 60 ?  [name] Cancer Maternal Aunt 35       breast; ovary  . Breast cancer Maternal Aunt   . Hyperlipidemia Mother   . Heart attack Mother   . Other Sister 20       lynch syndrome    GYNECOLOGIC HISTORY:  Patient's last menstrual period was 10/26/2012.     ADVANCED DIRECTIVES:    HEALTH MAINTENANCE: Social History   Tobacco Use  . Smoking status: Never Smoker  . Smokeless tobacco: Never Used  Vaping Use  . Vaping Use: Never used  Substance Use Topics  . Alcohol use: No  . Drug use: No     Colonoscopy:  PAP:  Bone density: 12/2015  Mammogram: 11/2015  No Known Allergies  Current Outpatient Medications  Medication Sig Dispense Refill  . aspirin 81 MG tablet Take 81 mg by mouth daily.    . Calcium-Magnesium-Vitamin D (CALCIUM MAGNESIUM PO) Take by mouth 2 (two) times daily.     No current facility-administered medications for this visit.    OBJECTIVE: LMP 10/26/2012    There is no height or weight on file to calculate BMI.    ECOG FS:0 - Asymptomatic  General: Well-developed, well-nourished, no acute distress. Eyes: Pink conjunctiva, anicteric sclera. HEENT: Normocephalic, moist mucous membranes. Breast: Patient declined breast exam today. Lungs: Clear to auscultation bilaterally. Heart: Regular rate and rhythm. No rubs, murmurs, or gallops. Abdomen: Soft, nontender, nondistended. No organomegaly noted, normoactive bowel sounds. Musculoskeletal: No edema, cyanosis, or  clubbing. Neuro: Alert, answering all questions appropriately. Cranial nerves grossly intact. Skin: No rashes or petechiae noted. Psych: Normal affect.  LAB RESULTS: CBC and MetC from [facility] on July 10, 2016 reported within normal limits. CA-27-29 9.2.  STUDIES: No results found.  ASSESSMENT: Pathologic stage Ia ER/PR positive, HER-2 negative invasive carcinoma of the right breast, unspecified site.  PLAN:   1. Pathologic stage Ia ER/PR positive, HER-2 negative invasive carcinoma of the right breast, unspecified site: Patient is status post lumpectomy, radiation therapy. Oncotype DX score of 12, low risk. She initially started on tamoxifen in August 2013. Patient had a bilateral oophorectomy performed in May 2015 and was switched to Aromasin and completed 5 years of treatment in August 2018. Given the low risk Oncotype and low stage of disease, there was likely no benefit and extending endocrine therapy for 7 or 10 years.  Her most recent mammogram on Dec 02, 2018  was reported as BI-RADS 1.  Repeat in May 2021.  After lengthy discussion with the patient, it was decided that no further follow-up is necessary.  She agreed to have her primary care physician perform yearly breast exams and order mammograms.  Please refer patient back if there are any questions or concerns.      2. Osteopenia: Bone mineral density completed on December 22, 2015 reported T score of -1.9. Continue monitoring per primary care. Continue calcium and vitamin D supplementation.  I spent a total of 20 minutes face-to-face with the patient of which greater than 50% of the visit was spent in counseling and coordination of care as detailed above.   Patient expressed understanding and was in agreement with this plan. She also understands that She can call clinic at any time with any questions, concerns, or complaints.   Breast cancer [facility])   Staging form: Breast, AJCC 7th Edition     Clinical: Stage IA (T1c, N0, M0) -  Unsigned   Lloyd Huger, MD   07/15/2020 10:51 AM

## 2020-07-19 ENCOUNTER — Inpatient Hospital Stay: Payer: BC Managed Care – PPO

## 2020-07-19 ENCOUNTER — Inpatient Hospital Stay: Payer: BC Managed Care – PPO | Admitting: Oncology

## 2020-07-19 DIAGNOSIS — D721 Eosinophilia, unspecified: Secondary | ICD-10-CM

## 2020-07-19 DIAGNOSIS — C50911 Malignant neoplasm of unspecified site of right female breast: Secondary | ICD-10-CM

## 2020-07-22 NOTE — Progress Notes (Signed)
[place]  Telephone:(336) [phone]  Fax:(336) [place] DOB: June 02, 1966  MR#: [id number]  ZPH#:[id number]  Patient Care Team: Burnard Hawthorne, FNP as PCP - General (Family Medicine) Bary Castilla, Forest Gleason, MD as Consulting Physician (General Surgery)  CHIEF COMPLAINT: Eosinophilia  INTERVAL HISTORY: Patient has a history of stage I breast cancer, but is referred back for persistently elevated eosinophil level.  She currently feels well and is asymptomatic.  She has no neurological plaints.  She denies any recent fevers or illnesses.  She has a good appetite and denies weight loss.  She has no chest pain, shortness of breath, cough, or hemoptysis.  She denies any nausea, vomiting, constipation, or diarrhea.  She has no urinary complaints.  Patient feels at her baseline and offers no specific complaints today.  REVIEW OF SYSTEMS:   Review of Systems  Constitutional: Negative.  Negative for fever, malaise/fatigue and weight loss.  Respiratory: Negative.  Negative for cough and shortness of breath.   Cardiovascular: Negative.  Negative for chest pain and leg swelling.  Gastrointestinal: Negative.  Negative for abdominal pain.  Genitourinary: Negative.  Negative for dysuria.  Musculoskeletal: Negative.  Negative for myalgias.  Skin: Negative.  Negative for rash.  Neurological: Negative.  Negative for sensory change, focal weakness, weakness and headaches.  Psychiatric/Behavioral: Negative.  The patient is not nervous/anxious.     As per HPI. Otherwise, a complete review of systems is negative.  ONCOLOGY HISTORY: Oncology History Overview Note  Chief Complaint/Diagnosis:   62. 55 year old female status was wide local excision and sentinel node biopsy for a clinical stage I (T1 C. N0 M0) ER/PR positive HER-2/neu negative invasive mammary carcinoma.PATIENT HAS    FISH RATIO OF 1.67 status post lumpectomy(April, 2013) Oncotype DX.  Lowest risk.  Score of 12  with 8-10% risk over   10 years for recurrent disease 2. Started on tamoxifen from August of 2013. 3. BRCA1 and BRCA2 no mutation identified (May of 2013) 4.Started on Lupron from December 23, 2012 tamoxifen was discontinued been patient was started aromasin 5.bilateral oophorectomy was done in May of 2015 Patient is in postmenopausal status and has been started on Aromasin   Breast cancer, right ([place])  10/27/2012 Initial Diagnosis   Breast cancer     PAST MEDICAL HISTORY: Past Medical History:  Diagnosis Date  . Basal cell carcinoma 05/2009   upper chest; Dr. [name]  . Breast cancer ([place])   . Cancer [facility]) 2013   November 05, 2011 wide excision and sentinel node biopsy for invasive ductal carcinoma involving the right breast.  . Carcinoma of right breast ([place]) 12/28/2014  . Chicken pox   . Colon polyps 2013   hyperplastic polyps x1  . Cystitis   . Genetic screening 01/30/2017   Invitae, variant of uncertain significant DICER1  . Hemorrhoids   . History of Papanicolaou smear of cervix 08/09/10; 11/03/13   -/-; -/-  . Kidney stones   . Malignant neoplasm of upper-outer quadrant of female breast ([place]) 11/05/2011   T1C, N0, M0; ER 90%, PR 90%, HER-2/neu not over expressed. Oncotype DX score 14: low risk of recurrence with antiestrogen alone at 8%.  . Menorrhagia   . Migraines   . Personal history of malignant neoplasm of breast 2013   BRCA negative, May 2013  . Personal history of radiation therapy 2013   right breast cancer    PAST SURGICAL HISTORY: Past Surgical History:  Procedure Laterality Date  . basal  cell skin excision  05/2009   Dr. [name]  . BREAST BIOPSY Right 10/2011   bx +   . BREAST LUMPECTOMY Right 11/05/2011   November 05, 2011 wide excision and sentinel node biopsy for invasive ductal carcinoma involving the right breast.  . BREAST SURGERY Right 2013   Wide excision, mastoplasty, sentinel node biopsy.  . COLONOSCOPY  2013   hyperplastic polyp identified in  the sigmoid  04/21/2012  , Dr. [name]  . DILATION AND CURETTAGE OF UTERUS  2001  . LAPAROSCOPY  11/09/2013   iud removal  . LASIK  2013  . OOPHORECTOMY Bilateral Nov 09 2013   Dr. [name]    FAMILY HISTORY Family History  Problem Relation Age of Onset  . Colon cancer Father 7        48, 51, 80, 20, currently has at age 50  . Heart disease Father        mini stroke  . Other Father        lynch syndrome Dx at age 47 ?  [name] Cancer Maternal Aunt 35       breast; ovary  . Breast cancer Maternal Aunt   . Hyperlipidemia Mother   . Heart attack Mother   . Other Sister 51       lynch syndrome  . Cancer Sister     GYNECOLOGIC HISTORY:  Patient's last menstrual period was 10/26/2012.     ADVANCED DIRECTIVES:    HEALTH MAINTENANCE: Social History   Tobacco Use  . Smoking status: Never Smoker  . Smokeless tobacco: Never Used  Vaping Use  . Vaping Use: Never used  Substance Use Topics  . Alcohol use: No  . Drug use: No     Colonoscopy:  PAP:  Bone density: 12/2015  Mammogram: 11/2015  No Known Allergies  Current Outpatient Medications  Medication Sig Dispense Refill  . aspirin 81 MG tablet Take 81 mg by mouth daily.    . Calcium-Magnesium-Vitamin D (CALCIUM MAGNESIUM PO) Take by mouth 2 (two) times daily.    . vitamin C (ASCORBIC ACID) 500 MG tablet Take 500 mg by mouth daily.    [name] ZINC MT Use as directed in the mouth or throat.     No current facility-administered medications for this visit.    OBJECTIVE: BP 109/75   Temp 98.5 F (36.9 C)   Wt 140 lb 1.6 oz (63.5 kg)   LMP 10/26/2012   BMI 23.31 kg/m    Body mass index is 23.31 kg/m.    ECOG FS:0 - Asymptomatic  General: Well-developed, well-nourished, no acute distress. Eyes: Pink conjunctiva, anicteric sclera. HEENT: Normocephalic, moist mucous membranes. Lungs: No audible wheezing or coughing. Heart: Regular rate and rhythm. Abdomen: Soft, nontender, no obvious distention. Musculoskeletal: No  edema, cyanosis, or clubbing. Neuro: Alert, answering all questions appropriately. Cranial nerves grossly intact. Skin: No rashes or petechiae noted. Psych: Normal affect.   LAB RESULTS: CBC and MetC from [facility] on July 10, 2016 reported within normal limits. CA-27-29 9.2.  STUDIES: No results found.  ASSESSMENT: Eosinophilia.  History of breast cancer.    PLAN:    1.  Eosinophilia: Patient was noted to have a persistently elevated eosinophil level on routine blood work.  She is asymptomatic.  Patient will receive her laboratory work at [facility] including CBC, tryptase levels, and flow cytometry.  No intervention is needed at this time.  Patient does not require bone marrow biopsy.  She will have a video assisted telemedicine visit  in 3 weeks to discuss her results. 2.  History of pathologic stage Ia ER/PR positive, HER-2 negative invasive carcinoma of the right breast, unspecified site: Patient is status post lumpectomy, radiation therapy. Oncotype DX score of 12, low risk. She initially started on tamoxifen in August 2013. Patient had a bilateral oophorectomy performed in May 2015 and was switched to Aromasin and completed 5 years of treatment in August 2018.  Her most recent mammogram on December 08, 2019 was reported as BI-RADS 1. 3. Osteopenia: Her most recent bone mineral density on December 08, 2019 reported T score of -2.3 which is slightly worse than previous.  Continue monitoring and treatment per primary care.     Patient expressed understanding and was in agreement with this plan. She also understands that She can call clinic at any time with any questions, concerns, or complaints.   Breast cancer [facility])   Staging form: Breast, AJCC 7th Edition     Clinical: Stage IA (T1c, N0, M0) - Unsigned   Lloyd Huger, MD   07/29/2020 9:35 AM

## 2020-07-26 DIAGNOSIS — D225 Melanocytic nevi of trunk: Secondary | ICD-10-CM | POA: Diagnosis not present

## 2020-07-26 DIAGNOSIS — D2262 Melanocytic nevi of left upper limb, including shoulder: Secondary | ICD-10-CM | POA: Diagnosis not present

## 2020-07-26 DIAGNOSIS — Z85828 Personal history of other malignant neoplasm of skin: Secondary | ICD-10-CM | POA: Diagnosis not present

## 2020-07-26 DIAGNOSIS — D2272 Melanocytic nevi of left lower limb, including hip: Secondary | ICD-10-CM | POA: Diagnosis not present

## 2020-07-27 ENCOUNTER — Encounter: Payer: Self-pay | Admitting: Oncology

## 2020-07-27 ENCOUNTER — Inpatient Hospital Stay: Payer: BC Managed Care – PPO

## 2020-07-27 ENCOUNTER — Inpatient Hospital Stay: Payer: BC Managed Care – PPO | Attending: Oncology | Admitting: Oncology

## 2020-07-27 VITALS — BP 109/75 | Temp 98.5°F | Wt 140.1 lb

## 2020-07-27 DIAGNOSIS — M8580 Other specified disorders of bone density and structure, unspecified site: Secondary | ICD-10-CM | POA: Diagnosis not present

## 2020-07-27 DIAGNOSIS — D721 Eosinophilia, unspecified: Secondary | ICD-10-CM | POA: Insufficient documentation

## 2020-07-27 DIAGNOSIS — M2392 Unspecified internal derangement of left knee: Secondary | ICD-10-CM | POA: Diagnosis not present

## 2020-07-27 DIAGNOSIS — Z79811 Long term (current) use of aromatase inhibitors: Secondary | ICD-10-CM | POA: Diagnosis not present

## 2020-07-27 DIAGNOSIS — Z853 Personal history of malignant neoplasm of breast: Secondary | ICD-10-CM | POA: Insufficient documentation

## 2020-07-27 DIAGNOSIS — Z923 Personal history of irradiation: Secondary | ICD-10-CM | POA: Insufficient documentation

## 2020-07-28 ENCOUNTER — Other Ambulatory Visit: Payer: Self-pay | Admitting: Oncology

## 2020-07-28 DIAGNOSIS — D721 Eosinophilia, unspecified: Secondary | ICD-10-CM | POA: Diagnosis not present

## 2020-07-28 NOTE — Progress Notes (Signed)
Re: [employer] called to clarify an order.   Flow cytometry was ordered by Dr. Grayland Ormond.   Unsure if patient had this drawn. Order placed for her to have drawn at cancer center or we can have her go back to have lab drawn.   [id number] order number  Faythe Casa, NP 07/28/2020 11:00 AM

## 2020-08-14 NOTE — Progress Notes (Signed)
[place]  Telephone:(336) [phone]  Fax:(336) [place] DOB: April 15, 1966  MR#: [id number]  MCN#:[id number]  Patient Care Team: Burnard Hawthorne, FNP as PCP - General (Family Medicine) Bary Castilla Forest Gleason, MD as Consulting Physician (General Surgery)  I connected with Cynthia Willis on 08/18/20 at  3:45 PM EST by video enabled telemedicine visit and verified that I am speaking with the correct person using two identifiers.   I discussed the limitations, risks, security and privacy concerns of performing an evaluation and management service by telemedicine and the availability of in-person appointments. I also discussed with the patient that there may be a patient responsible charge related to this service. The patient expressed understanding and agreed to proceed.   Other persons participating in the visit and their role in the encounter: Patient, MD.  Patient's location: Home. Provider's location: Clinic.  CHIEF COMPLAINT: Eosinophilia  INTERVAL HISTORY: Patient initially agreed to video assisted telemedicine visit, but secondary to technical difficulties encounter was completed by phone.  She continues to feel well and remains asymptomatic.  She has no neurologic complaints. She denies any recent fevers or illnesses.  She has a good appetite and denies weight loss.  She has no chest pain, shortness of breath, cough, or hemoptysis.  She denies any nausea, vomiting, constipation, or diarrhea.  She has no urinary complaints.  Patient offers no specific complaints today.  REVIEW OF SYSTEMS:   Review of Systems  Constitutional: Negative.  Negative for fever, malaise/fatigue and weight loss.  Respiratory: Negative.  Negative for cough and shortness of breath.   Cardiovascular: Negative.  Negative for chest pain and leg swelling.  Gastrointestinal: Negative.  Negative for abdominal pain.  Genitourinary: Negative.  Negative for dysuria.  Musculoskeletal:  Negative.  Negative for myalgias.  Skin: Negative.  Negative for rash.  Neurological: Negative.  Negative for sensory change, focal weakness, weakness and headaches.  Psychiatric/Behavioral: Negative.  The patient is not nervous/anxious.     As per HPI. Otherwise, a complete review of systems is negative.  ONCOLOGY HISTORY: Oncology History Overview Note  Chief Complaint/Diagnosis:   30. 55 year old female status was wide local excision and sentinel node biopsy for a clinical stage I (T1 C. N0 M0) ER/PR positive HER-2/neu negative invasive mammary carcinoma.PATIENT HAS    FISH RATIO OF 1.67 status post lumpectomy(April, 2013) Oncotype DX.  Lowest risk.  Score of 12 with 8-10% risk over   10 years for recurrent disease 2. Started on tamoxifen from August of 2013. 3. BRCA1 and BRCA2 no mutation identified (May of 2013) 4.Started on Lupron from December 23, 2012 tamoxifen was discontinued been patient was started aromasin 5.bilateral oophorectomy was done in May of 2015 Patient is in postmenopausal status and has been started on Aromasin   Breast cancer, right ([place])  10/27/2012 Initial Diagnosis   Breast cancer     PAST MEDICAL HISTORY: Past Medical History:  Diagnosis Date  . Basal cell carcinoma 05/2009   upper chest; Dr. [name]  . Breast cancer ([place])   . Cancer [facility]) 2013   November 05, 2011 wide excision and sentinel node biopsy for invasive ductal carcinoma involving the right breast.  . Carcinoma of right breast ([place]) 12/28/2014  . Chicken pox   . Colon polyps 2013   hyperplastic polyps x1  . Cystitis   . Genetic screening 01/30/2017   Invitae, variant of uncertain significant DICER1  . Hemorrhoids   . History of Papanicolaou smear of cervix  08/09/10; 11/03/13   -/-; -/-  . Kidney stones   . Malignant neoplasm of upper-outer quadrant of female breast ([place]) 11/05/2011   T1C, N0, M0; ER 90%, PR 90%, HER-2/neu not over expressed. Oncotype DX score 14: low risk of recurrence with  antiestrogen alone at 8%.  . Menorrhagia   . Migraines   . Personal history of malignant neoplasm of breast 2013   BRCA negative, May 2013  . Personal history of radiation therapy 2013   right breast cancer    PAST SURGICAL HISTORY: Past Surgical History:  Procedure Laterality Date  . basal cell skin excision  05/2009   Dr. [name]  . BREAST BIOPSY Right 10/2011   bx +   . BREAST LUMPECTOMY Right 11/05/2011   November 05, 2011 wide excision and sentinel node biopsy for invasive ductal carcinoma involving the right breast.  . BREAST SURGERY Right 2013   Wide excision, mastoplasty, sentinel node biopsy.  . COLONOSCOPY  2013   hyperplastic polyp identified in the sigmoid  04/21/2012  , Dr. [name]  . DILATION AND CURETTAGE OF UTERUS  2001  . LAPAROSCOPY  11/09/2013   iud removal  . LASIK  2013  . OOPHORECTOMY Bilateral Nov 09 2013   Dr. [name]    FAMILY HISTORY Family History  Problem Relation Age of Onset  . Colon cancer Father 70        48, 58, 58, 3, currently has at age 14  . Heart disease Father        mini stroke  . Other Father        lynch syndrome Dx at age 33 ?  [name] Cancer Maternal Aunt 35       breast; ovary  . Breast cancer Maternal Aunt   . Hyperlipidemia Mother   . Heart attack Mother   . Other Sister 67       lynch syndrome  . Cancer Sister     GYNECOLOGIC HISTORY:  Patient's last menstrual period was 10/26/2012.     ADVANCED DIRECTIVES:    HEALTH MAINTENANCE: Social History   Tobacco Use  . Smoking status: Never Smoker  . Smokeless tobacco: Never Used  Vaping Use  . Vaping Use: Never used  Substance Use Topics  . Alcohol use: No  . Drug use: No     Colonoscopy:  PAP:  Bone density: 12/2015  Mammogram: 11/2015  No Known Allergies  Current Outpatient Medications  Medication Sig Dispense Refill  . aspirin 81 MG tablet Take 81 mg by mouth daily.    . Calcium-Magnesium-Vitamin D (CALCIUM MAGNESIUM PO) Take by mouth 2 (two) times  daily.    . vitamin C (ASCORBIC ACID) 500 MG tablet Take 500 mg by mouth daily.    [name] ZINC MT Use as directed in the mouth or throat.     No current facility-administered medications for this visit.    OBJECTIVE: LMP 10/26/2012    There is no height or weight on file to calculate BMI.    ECOG FS:0 - Asymptomatic  General: Well-developed, well-nourished, no acute distress. HEENT: Normocephalic. Neuro: Alert, answering all questions appropriately. Cranial nerves grossly intact. Psych: Normal affect.   LAB RESULTS: CBC and MetC from [facility] on July 10, 2016 reported within normal limits. CA-27-29 9.2.  STUDIES: No results found.  ASSESSMENT: Eosinophilia.  History of breast cancer.    PLAN:    1.  Eosinophilia: Patient was noted to have a persistently elevated eosinophil level on routine blood work.  She is asymptomatic.  White blood cell count continues to be within normal limits with 14% eosinophils which is unchanged from previous.  Peripheral blood flow cytometry was also negative with no apparent white blood cells noted.  Tryptase levels were not reported.  No intervention is needed at this time.  Patient does not require bone marrow biopsy.  She will return to clinic in 6 months with repeat laboratory work and further evaluation via video telemedicine visit.  If everything remains unchanged and she is asymptomatic, she could possibly be discharged from clinic.   2.  History of pathologic stage Ia ER/PR positive, HER-2 negative invasive carcinoma of the right breast, unspecified site: Patient is status post lumpectomy, radiation therapy. Oncotype DX score of 12, low risk. She initially started on tamoxifen in August 2013. Patient had a bilateral oophorectomy performed in May 2015 and was switched to Aromasin and completed 5 years of treatment in August 2018.  Her most recent mammogram on December 08, 2019 was reported as BI-RADS 1. 3. Osteopenia: Her most recent bone mineral density on December 08, 2019 reported T score of -2.3 which is slightly worse than previous.  Continue monitoring and treatment per primary care.    I provided 20 minutes of face-to-face video visit time during this encounter which included chart review, counseling, and coordination of care as documented above.   Patient expressed understanding and was in agreement with this plan. She also understands that She can call clinic at any time with any questions, concerns, or complaints.   Breast cancer [facility])   Staging form: Breast, AJCC 7th Edition     Clinical: Stage IA (T1c, N0, M0) - Unsigned   Lloyd Huger, MD   08/18/2020 6:11 AM

## 2020-08-15 ENCOUNTER — Encounter: Payer: Self-pay | Admitting: Oncology

## 2020-08-17 ENCOUNTER — Inpatient Hospital Stay: Payer: BC Managed Care – PPO | Attending: Oncology | Admitting: Oncology

## 2020-08-17 ENCOUNTER — Encounter: Payer: Self-pay | Admitting: Oncology

## 2020-08-17 DIAGNOSIS — D721 Eosinophilia, unspecified: Secondary | ICD-10-CM | POA: Diagnosis not present

## 2020-08-17 NOTE — Progress Notes (Signed)
Patient denies any concerns today.  

## 2021-01-22 ENCOUNTER — Other Ambulatory Visit: Payer: Self-pay | Admitting: Family

## 2021-01-22 DIAGNOSIS — Z1231 Encounter for screening mammogram for malignant neoplasm of breast: Secondary | ICD-10-CM

## 2021-02-02 ENCOUNTER — Other Ambulatory Visit: Payer: Self-pay

## 2021-02-02 ENCOUNTER — Ambulatory Visit
Admission: RE | Admit: 2021-02-02 | Discharge: 2021-02-02 | Disposition: A | Discharge status: Home / Self Care | Payer: BC Managed Care – PPO | Source: Ambulatory Visit | Attending: Family | Admitting: Family

## 2021-02-02 DIAGNOSIS — Z1231 Encounter for screening mammogram for malignant neoplasm of breast: Secondary | ICD-10-CM | POA: Diagnosis not present

## 2021-02-09 NOTE — Progress Notes (Deleted)
[place]  [phone]) [phone]  Fax:([phone]) [place] DOB: 11/07/1965  MR#: [id number]  EHM#:[id number]  Patient Care Team: Burnard Hawthorne, FNP as PCP - General (Family Medicine) Bary Castilla Forest Gleason, MD as Consulting Physician (General Surgery)  I connected with Cynthia Willis on 02/09/21 at  3:30 PM EDT by {Blank single:19197::"video enabled telemedicine visit","telephone visit"} and verified that I am speaking with the correct person using two identifiers.   I discussed the limitations, risks, security and privacy concerns of performing an evaluation and management service by telemedicine and the availability of in-person appointments. I also discussed with the patient that there may be a patient responsible charge related to this service. The patient expressed understanding and agreed to proceed.   Other persons participating in the visit and their role in the encounter: Patient, MD.  Patient's location: Home. Provider's location: Clinic.  CHIEF COMPLAINT: Eosinophilia  INTERVAL HISTORY: Patient initially agreed to video assisted telemedicine visit, but secondary to technical difficulties encounter was completed by phone.  She continues to feel well and remains asymptomatic.  She has no neurologic complaints. She denies any recent fevers or illnesses.  She has a good appetite and denies weight loss.  She has no chest pain, shortness of breath, cough, or hemoptysis.  She denies any nausea, vomiting, constipation, or diarrhea.  She has no urinary complaints.  Patient offers no specific complaints today.  REVIEW OF SYSTEMS:   Review of Systems  Constitutional: Negative.  Negative for fever, malaise/fatigue and weight loss.  Respiratory: Negative.  Negative for cough and shortness of breath.   Cardiovascular: Negative.  Negative for chest pain and leg swelling.  Gastrointestinal: Negative.  Negative for abdominal pain.  Genitourinary: Negative.   Negative for dysuria.  Musculoskeletal: Negative.  Negative for myalgias.  Skin: Negative.  Negative for rash.  Neurological: Negative.  Negative for sensory change, focal weakness, weakness and headaches.  Psychiatric/Behavioral: Negative.  The patient is not nervous/anxious.    As per HPI. Otherwise, a complete review of systems is negative.  ONCOLOGY HISTORY: Oncology History Overview Note  Chief Complaint/Diagnosis:   75. 55 year old female status was wide local excision and sentinel node biopsy for a clinical stage I (T1 C. N0 M0) ER/PR positive HER-2/neu negative invasive mammary carcinoma.PATIENT HAS    FISH RATIO OF 1.67 status post lumpectomy(April, 2013) Oncotype DX.  Lowest risk.  Score of 12 with 8-10% risk over   10 years for recurrent disease 2. Started on tamoxifen from August of 2013. 3. BRCA1 and BRCA2 no mutation identified (May of 2013) 4.Started on Lupron from December 23, 2012 tamoxifen was discontinued been patient was started aromasin 5.bilateral oophorectomy was done in May of 2015 Patient is in postmenopausal status and has been started on Aromasin   Breast cancer, right ([place])  10/27/2012 Initial Diagnosis   Breast cancer     PAST MEDICAL HISTORY: Past Medical History:  Diagnosis Date   Basal cell carcinoma 05/2009   upper chest; Dr. [name]   Breast cancer [facility])    Cancer [facility]) 2013   November 05, 2011 wide excision and sentinel node biopsy for invasive ductal carcinoma involving the right breast.   Carcinoma of right breast ([place]) 12/28/2014   Chicken pox    Colon polyps 2013   hyperplastic polyps x1   Cystitis    Genetic screening 01/30/2017   Invitae, variant of uncertain significant DICER1   Hemorrhoids    History of Papanicolaou smear of  cervix 08/09/10; 11/03/13   -/-; -/-   Kidney stones    Malignant neoplasm of upper-outer quadrant of female breast ([place]) 11/05/2011   T1C, N0, M0; ER 90%, PR 90%, HER-2/neu not over expressed. Oncotype DX score 14:  low risk of recurrence with antiestrogen alone at 8%.   Menorrhagia    Migraines    Personal history of malignant neoplasm of breast 2013   BRCA negative, May 2013   Personal history of radiation therapy 2013   right breast cancer    PAST SURGICAL HISTORY: Past Surgical History:  Procedure Laterality Date   basal cell skin excision  05/2009   Dr. [name]   BREAST BIOPSY Right 10/2011   bx +    BREAST LUMPECTOMY Right 11/05/2011   November 05, 2011 wide excision and sentinel node biopsy for invasive ductal carcinoma involving the right breast.   BREAST SURGERY Right 2013   Wide excision, mastoplasty, sentinel node biopsy.   COLONOSCOPY  2013   hyperplastic polyp identified in the sigmoid  04/21/2012  , Dr. [name]   DILATION AND CURETTAGE OF UTERUS  2001   LAPAROSCOPY  11/09/2013   iud removal   LASIK  2013   OOPHORECTOMY Bilateral Nov 09 2013   Dr. [name]    FAMILY HISTORY Family History  Problem Relation Age of Onset   Colon cancer Father 44        48, 79, 10, 63, currently has at age 68   Heart disease Father        mini stroke   Other Father        lynch syndrome Dx at age 15 ?   Cancer Maternal Aunt 41       breast; ovary   Breast cancer Maternal Aunt    Hyperlipidemia Mother    Heart attack Mother    Other Sister 33       lynch syndrome   Cancer Sister     GYNECOLOGIC HISTORY:  Patient's last menstrual period was 10/26/2012.     ADVANCED DIRECTIVES:    HEALTH MAINTENANCE: Social History   Tobacco Use   Smoking status: Never   Smokeless tobacco: Never  Vaping Use   Vaping Use: Never used  Substance Use Topics   Alcohol use: No   Drug use: No     Colonoscopy:  PAP:  Bone density: 12/2015  Mammogram: 11/2015  No Known Allergies  Current Outpatient Medications  Medication Sig Dispense Refill   aspirin 81 MG tablet Take 81 mg by mouth daily.     Calcium-Magnesium-Vitamin D (CALCIUM MAGNESIUM PO) Take by mouth 2 (two) times daily.      vitamin C (ASCORBIC ACID) 500 MG tablet Take 500 mg by mouth daily.     ZINC MT Use as directed in the mouth or throat.     No current facility-administered medications for this visit.    OBJECTIVE: LMP 10/26/2012    There is no height or weight on file to calculate BMI.    ECOG FS:0 - Asymptomatic  General: Well-developed, well-nourished, no acute distress. HEENT: Normocephalic. Neuro: Alert, answering all questions appropriately. Cranial nerves grossly intact. Psych: Normal affect.   LAB RESULTS: CBC and MetC from [place] on July 10, 2016 reported within normal limits. CA-27-29 9.2.  STUDIES: No results found.  ASSESSMENT: Eosinophilia.  History of breast cancer.    PLAN:    1.  Eosinophilia: Patient was noted to have a persistently elevated eosinophil level on routine blood work.  She is asymptomatic.  White blood cell count continues to be within normal limits with 14% eosinophils which is unchanged from previous.  Peripheral blood flow cytometry was also negative with no apparent white blood cells noted.  Tryptase levels were not reported.  No intervention is needed at this time.  Patient does not require bone marrow biopsy.  She will return to clinic in 6 months with repeat laboratory work and further evaluation via video telemedicine visit.  If everything remains unchanged and she is asymptomatic, she could possibly be discharged from clinic.   2.  History of pathologic stage Ia ER/PR positive, HER-2 negative invasive carcinoma of the right breast, unspecified site: Patient is status post lumpectomy, radiation therapy. Oncotype DX score of 12, low risk. She initially started on tamoxifen in August 2013. Patient had a bilateral oophorectomy performed in May 2015 and was switched to Aromasin and completed 5 years of treatment in August 2018.  Her most recent mammogram on December 08, 2019 was reported as BI-RADS 1. 3. Osteopenia: Her most recent bone mineral density on December 08, 2019  reported T score of -2.3 which is slightly worse than previous.  Continue monitoring and treatment per primary care.    I provided *** minutes of {Blank single:19197::"face-to-face video visit time","non face-to-face telephone visit time"} during this encounter which included chart review, counseling, and coordination of care as documented above.   Patient expressed understanding and was in agreement with this plan. She also understands that She can call clinic at any time with any questions, concerns, or complaints.   Breast cancer [facility])   Staging form: Breast, AJCC 7th Edition     Clinical: Stage IA (T1c, N0, M0) - Unsigned   Lloyd Huger, MD   02/09/2021 11:33 AM

## 2021-02-15 ENCOUNTER — Inpatient Hospital Stay: Payer: BC Managed Care – PPO | Admitting: Oncology

## 2021-02-15 DIAGNOSIS — D721 Eosinophilia, unspecified: Secondary | ICD-10-CM

## 2021-02-21 ENCOUNTER — Encounter: Payer: Self-pay | Admitting: Family

## 2021-02-21 ENCOUNTER — Ambulatory Visit (INDEPENDENT_AMBULATORY_CARE_PROVIDER_SITE_OTHER): Payer: BC Managed Care – PPO | Admitting: Family

## 2021-02-21 ENCOUNTER — Other Ambulatory Visit: Payer: Self-pay

## 2021-02-21 VITALS — BP 118/82 | HR 77 | Temp 96.1°F | Ht 65.0 in | Wt 141.8 lb

## 2021-02-21 DIAGNOSIS — L0292 Furuncle, unspecified: Secondary | ICD-10-CM

## 2021-02-21 DIAGNOSIS — Z0000 Encounter for general adult medical examination without abnormal findings: Secondary | ICD-10-CM

## 2021-02-21 DIAGNOSIS — D721 Eosinophilia, unspecified: Secondary | ICD-10-CM | POA: Diagnosis not present

## 2021-02-21 DIAGNOSIS — Z800 Family history of malignant neoplasm of digestive organs: Secondary | ICD-10-CM

## 2021-02-21 MED ORDER — MUPIROCIN 2 % EX OINT: 1.0000 "application " | TOPICAL_OINTMENT | Freq: Two times a day (BID) | CUTANEOUS | 1 refills | Status: DC

## 2021-02-21 NOTE — Assessment & Plan Note (Signed)
Following with Dr. Grayland Ormond, will follow

## 2021-02-21 NOTE — Progress Notes (Signed)
Subjective:    Patient ID: Cynthia Willis, female    DOB: [birth year], 55 y.o.   MRN: [id number]  CC: [name] is a 55 y.o. female who presents today for physical exam.    HPI: She feels well today No new complaints  She does reports red skin lesion right side of chest 2 to 3 weeks ago.   No tick removed, fever, chills.  Endorses redness and tenderness. No drainage.  Used warm compresses and OTC topical antibiotic with improvement. No  h/o mrsa.   she doesn't follow with Dr Terri Piedra any longer.  She has family h/o lynch syndrome ( father and sister)  and reports tested negative for lynch syndrome.     Eosinophilia-consult with Dr. Grayland Ormond which she advised no intervention is needed at this time.  She will return in 6 months to repeat laboratory work ( 02/2021).  Follow-up scheduled in 2 weeks   h/o basal cell carcinoma- follows with dermatology Dr Kellie Moor  Colorectal Cancer Screening: UTD and abstracted in chart.  Performed 04/23/2018.  Advised to repeat in 5 years time Breast Cancer Screening: Mammogram UTD.  History of right breast cancer.  She is no longer on tamoxifen, aromasin.   Cervical Cancer Screening: Due; she follows with GYN, [facility].  Bone Health screening/DEXA for 65+: UTD, osteopenia         Tetanus - UTD Exercise: Gets regular exercise daily, with [employer], weight lifting.   Alcohol use:  none Smoking/tobacco use: Nonsmoker.     HISTORY:  Past Medical History:  Diagnosis Date   Basal cell carcinoma 05/2009   upper chest; Dr. [name]   Breast cancer [facility])    Cancer [facility]) 2013   November 05, 2011 wide excision and sentinel node biopsy for invasive ductal carcinoma involving the right breast.   Carcinoma of right breast ([place]) 12/28/2014   Chicken pox    Colon polyps 2013   hyperplastic polyps x1   Cystitis    Genetic screening 01/30/2017   Invitae, variant of uncertain significant DICER1   Hemorrhoids    History of Papanicolaou smear of cervix  08/09/10; 11/03/13   -/-; -/-   Kidney stones    Malignant neoplasm of upper-outer quadrant of female breast ([place]) 11/05/2011   T1C, N0, M0; ER 90%, PR 90%, HER-2/neu not over expressed. Oncotype DX score 14: low risk of recurrence with antiestrogen alone at 8%.   Menorrhagia    Migraines    Personal history of malignant neoplasm of breast 2013   BRCA negative, May 2013   Personal history of radiation therapy 2013   right breast cancer    Past Surgical History:  Procedure Laterality Date   basal cell skin excision  05/2009   Dr. [name]   BREAST BIOPSY Right 10/2011   bx +    BREAST LUMPECTOMY Right 11/05/2011   November 05, 2011 wide excision and sentinel node biopsy for invasive ductal carcinoma involving the right breast.   BREAST SURGERY Right 2013   Wide excision, mastoplasty, sentinel node biopsy.   COLONOSCOPY  2013   hyperplastic polyp identified in the sigmoid  04/21/2012  , Dr. [name]   DILATION AND CURETTAGE OF UTERUS  2001   LAPAROSCOPY  11/09/2013   iud removal   LASIK  2013   OOPHORECTOMY Bilateral Nov 09 2013   Dr. [name]   Family History  Problem Relation Age of Onset   Hyperlipidemia Mother    Heart attack Mother  Colon cancer Father 45        48, 37, 22, 22, currently has at age 110   Heart disease Father        mini stroke   Other Father        lynch syndrome Dx at age 78 ?   Other Sister 6       lynch syndrome   Breast cancer Sister 12   Cancer Maternal Aunt 35       breast; ovary   Breast cancer Maternal Aunt       ALLERGIES: Patient has no known allergies.  Current Outpatient Medications on File Prior to Visit  Medication Sig Dispense Refill   aspirin 81 MG tablet Take 81 mg by mouth daily.     Calcium-Magnesium-Vitamin D (CALCIUM MAGNESIUM PO) Take by mouth 2 (two) times daily.     vitamin C (ASCORBIC ACID) 500 MG tablet Take 500 mg by mouth daily.     ZINC MT Use as directed in the mouth or throat.     No current  facility-administered medications on file prior to visit.    Social History   Tobacco Use   Smoking status: Never   Smokeless tobacco: Never  Vaping Use   Vaping Use: Never used  Substance Use Topics   Alcohol use: No   Drug use: No    Review of Systems  Constitutional:  Negative for chills, fever and unexpected weight change.  HENT:  Negative for congestion.   Respiratory:  Negative for cough.   Cardiovascular:  Negative for chest pain, palpitations and leg swelling.  Gastrointestinal:  Negative for nausea and vomiting.  Musculoskeletal:  Negative for arthralgias and myalgias.  Skin:  Positive for rash.  Neurological:  Negative for headaches.  Hematological:  Negative for adenopathy.  Psychiatric/Behavioral:  Negative for confusion.      Objective:    BP 118/82   Pulse 77   Temp (!) 96.1 F (35.6 C) Comment: temporal  Ht '5\' 5"'  (1.651 m)   Wt 141 lb 12.8 oz (64.3 kg)   LMP 10/26/2012   SpO2 97%   BMI 23.60 kg/m   BP Readings from Last 3 Encounters:  02/21/21 118/82  07/27/20 109/75  11/08/19 110/80   Wt Readings from Last 3 Encounters:  02/21/21 141 lb 12.8 oz (64.3 kg)  07/27/20 140 lb 1.6 oz (63.5 kg)  11/08/19 138 lb 3.2 oz (62.7 kg)    Physical Exam Vitals reviewed.  Constitutional:      Appearance: Normal appearance. She is well-developed.  Eyes:     Conjunctiva/sclera: Conjunctivae normal.  Neck:     Thyroid: No thyroid mass or thyromegaly.  Cardiovascular:     Rate and Rhythm: Normal rate and regular rhythm.     Pulses: Normal pulses.     Heart sounds: Normal heart sounds.  Pulmonary:     Effort: Pulmonary effort is normal.     Breath sounds: Normal breath sounds. No wheezing, rhonchi or rales.  Chest:  Breasts:    Breasts are symmetrical.     Right: No inverted nipple, mass, nipple discharge, skin change or tenderness.     Left: No inverted nipple, mass, nipple discharge, skin change or tenderness.  Abdominal:     General: Bowel sounds  are normal. There is no distension.     Palpations: Abdomen is soft. Abdomen is not rigid. There is no fluid wave or mass.     Tenderness: There is no abdominal tenderness. There is no  guarding or rebound.  Lymphadenopathy:     Head:     Right side of head: No submental, submandibular, tonsillar, preauricular, posterior auricular or occipital adenopathy.     Left side of head: No submental, submandibular, tonsillar, preauricular, posterior auricular or occipital adenopathy.     Cervical: No cervical adenopathy.     Right cervical: No superficial, deep or posterior cervical adenopathy.    Left cervical: No superficial, deep or posterior cervical adenopathy.  Skin:    General: Skin is warm and dry.          Comments: Localized erythematous, slightly tender.  Noted right upper chest as noted on diagram.  Approximately 2 to 3 cm in diameter.  No bull's-eye rash.  Area is nonfluctuant.  No drainage or red streaks.  No increased warmth.  Neurological:     Mental Status: She is alert.  Psychiatric:        Speech: Speech normal.        Behavior: Behavior normal.        Thought Content: Thought content normal.       Assessment & Plan:   Problem List Items Addressed This Visit       Musculoskeletal and Integument   Boil    Nearly resolved.  Provided patient with Bactroban and advised continued close vigilance to ensure complete resolution.      Relevant Medications   mupirocin ointment (BACTROBAN) 2 %     Other   Eosinophilia    Following with Dr. Grayland Ormond, will follow      Family history of Lynch syndrome   Routine physical examination - Primary    Clinical breast exam performed today.  Patient declines Pap smear office as she prefers to have this done with GYN.  She will make that appointment.  Colonoscopy is up-to-date however I requested patient to bring colonoscopy record so we can have formally in her chart.  Encouraged continued exercise.      Relevant Orders   Comp Met  (CMET)   Lipid panel   HgB A1c   TSH   Vitamin D (25 hydroxy)     I am having Cynthia Willis start on mupirocin ointment. I am also having her maintain her aspirin, Calcium-Magnesium-Vitamin D (CALCIUM MAGNESIUM PO), ZINC MT, and vitamin C.   Meds ordered this encounter  Medications   mupirocin ointment (BACTROBAN) 2 %    Sig: Apply 1 application topically 2 (two) times daily.    Dispense:  22 g    Refill:  1    Order Specific Question:   Supervising Provider    Answer:   Crecencio Mc [[id number]]     Return precautions given.   Risks, benefits, and alternatives of the medications and treatment plan prescribed today were discussed, and patient expressed understanding.   Education regarding symptom management and diagnosis given to patient on AVS.   Continue to follow with Burnard Hawthorne, FNP for routine health maintenance.   Cynthia Willis and I agreed with plan.   Mable Paris, FNP

## 2021-02-21 NOTE — Patient Instructions (Signed)
Apply Bactroban ointment to area right side of chest.  If you notice increased redness, tenderness, streaking or develop fever, chills, please let me know right away.  Please also bring your colonoscopy record to our office we can put in your file when you can.  Health Maintenance for Postmenopausal Women Menopause is a normal process in which your ability to get pregnant comes to an end. This process happens slowly over many months or years, usually between the ages of 65 and 44. Menopause is complete when you have missed your menstrualperiods for 12 months. It is important to talk with your health care provider about some of the most common conditions that affect women after menopause (postmenopausal women). These include heart disease, cancer, and bone loss (osteoporosis). Adopting a healthy lifestyle and getting preventive care can help to promote your health and wellness. The actions you take can also lower your chances ofdeveloping some of these common conditions. What should I know about menopause? During menopause, you may get a number of symptoms, such as: Hot flashes. These can be moderate or severe. Night sweats. Decrease in sex drive. Mood swings. Headaches. Tiredness. Irritability. Memory problems. Insomnia. Choosing to treat or not to treat these symptoms is a decision that you makewith your health care provider. Do I need hormone replacement therapy? Hormone replacement therapy is effective in treating symptoms that are caused by menopause, such as hot flashes and night sweats. Hormone replacement carries certain risks, especially as you become older. If you are thinking about using estrogen or estrogen with progestin, discuss the benefits and risks with your health care provider. What is my risk for heart disease and stroke? The risk of heart disease, heart attack, and stroke increases as you age. One of the causes may be a change in the body's hormones during menopause. This  can affect how your body uses dietary fats, triglycerides, and cholesterol. Heart attack and stroke are medical emergencies. There are many things that you cando to help prevent heart disease and stroke. Watch your blood pressure High blood pressure causes heart disease and increases the risk of stroke. This is more likely to develop in people who have high blood pressure readings, are of African descent, or are overweight. Have your blood pressure checked: Every 3-5 years if you are 56-64 years of age. Every year if you are 10 years old or older. Eat a healthy diet  Eat a diet that includes plenty of vegetables, fruits, low-fat dairy products, and lean protein. Do not eat a lot of foods that are high in solid fats, added sugars, or sodium.  Get regular exercise Get regular exercise. This is one of the most important things you can do for your health. Most adults should: Try to exercise for at least 150 minutes each week. The exercise should increase your heart rate and make you sweat (moderate-intensity exercise). Try to do strengthening exercises at least twice each week. Do these in addition to the moderate-intensity exercise. Spend less time sitting. Even light physical activity can be beneficial. Other tips Work with your health care provider to achieve or maintain a healthy weight. Do not use any products that contain nicotine or tobacco, such as cigarettes, e-cigarettes, and chewing tobacco. If you need help quitting, ask your health care provider. Know your numbers. Ask your health care provider to check your cholesterol and your blood sugar (glucose). Continue to have your blood tested as directed by your health care provider. Do I need screening for cancer? Depending  on your health history and family history, you may need to have cancer screening at different stages of your life. This may include screening for: Breast cancer. Cervical cancer. Lung cancer. Colorectal cancer. What  is my risk for osteoporosis? After menopause, you may be at increased risk for osteoporosis. Osteoporosis is a condition in which bone destruction happens more quickly than new bone creation. To help prevent osteoporosis or the bone fractures that can happen because of osteoporosis, you may take the following actions: If you are 25-93 years old, get at least 1,000 mg of calcium and at least 600 mg of vitamin D per day. If you are older than age 39 but younger than age 56, get at least 1,200 mg of calcium and at least 600 mg of vitamin D per day. If you are older than age 2, get at least 1,200 mg of calcium and at least 800 mg of vitamin D per day. Smoking and drinking excessive alcohol increase the risk of osteoporosis. Eat foods that are rich in calcium and vitamin D, and do weight-bearing exercisesseveral times each week as directed by your health care provider. How does menopause affect my mental health? Depression may occur at any age, but it is more common as you become older. Common symptoms of depression include: Low or sad mood. Changes in sleep patterns. Changes in appetite or eating patterns. Feeling an overall lack of motivation or enjoyment of activities that you previously enjoyed. Frequent crying spells. Talk with your health care provider if you think that you are experiencingdepression. General instructions See your health care provider for regular wellness exams and vaccines. This may include: Scheduling regular health, dental, and eye exams. Getting and maintaining your vaccines. These include: Influenza vaccine. Get this vaccine each year before the flu season begins. Pneumonia vaccine. Shingles vaccine. Tetanus, diphtheria, and pertussis (Tdap) booster vaccine. Your health care provider may also recommend other immunizations. Tell your health care provider if you have ever been abused or do not feel safeat home. Summary Menopause is a normal process in which your ability  to get pregnant comes to an end. This condition causes hot flashes, night sweats, decreased interest in sex, mood swings, headaches, or lack of sleep. Treatment for this condition may include hormone replacement therapy. Take actions to keep yourself healthy, including exercising regularly, eating a healthy diet, watching your weight, and checking your blood pressure and blood sugar levels. Get screened for cancer and depression. Make sure that you are up to date with all your vaccines. This information is not intended to replace advice given to you by your health care provider. Make sure you discuss any questions you have with your healthcare provider. Document Revised: 06/17/2018 Document Reviewed: 06/17/2018 Elsevier Patient Education  2022 [employer].

## 2021-02-21 NOTE — Assessment & Plan Note (Signed)
Nearly resolved.  Provided patient with Bactroban and advised continued close vigilance to ensure complete resolution.

## 2021-02-21 NOTE — Assessment & Plan Note (Signed)
Clinical breast exam performed today.  Patient declines Pap smear office as she prefers to have this done with GYN.  She will make that appointment.  Colonoscopy is up-to-date however I requested patient to bring colonoscopy record so we can have formally in her chart.  Encouraged continued exercise.

## 2021-02-26 DIAGNOSIS — D721 Eosinophilia, unspecified: Secondary | ICD-10-CM | POA: Diagnosis not present

## 2021-02-28 ENCOUNTER — Encounter: Payer: Self-pay | Admitting: Oncology

## 2021-03-01 ENCOUNTER — Other Ambulatory Visit: Payer: Self-pay

## 2021-03-01 ENCOUNTER — Other Ambulatory Visit (INDEPENDENT_AMBULATORY_CARE_PROVIDER_SITE_OTHER): Payer: BC Managed Care – PPO

## 2021-03-01 DIAGNOSIS — Z0000 Encounter for general adult medical examination without abnormal findings: Secondary | ICD-10-CM | POA: Diagnosis not present

## 2021-03-01 LAB — COMPREHENSIVE METABOLIC PANEL
ALT: 30 U/L (ref 0–35)
AST: 29 U/L (ref 0–37)
Albumin: 4 g/dL (ref 3.5–5.2)
Alkaline Phosphatase: 77 U/L (ref 39–117)
BUN: 22 mg/dL (ref 6–23)
CO2: 28 mEq/L (ref 19–32)
Calcium: 9.3 mg/dL (ref 8.4–10.5)
Chloride: 103 mEq/L (ref 96–112)
Creatinine, Ser: 0.65 mg/dL (ref 0.40–1.20)
GFR: 99.51 mL/min (ref >60.00)
Glucose, Bld: 79 mg/dL (ref 70–99)
Potassium: 4 mEq/L (ref 3.5–5.1)
Sodium: 140 mEq/L (ref 135–145)
Total Bilirubin: 0.5 mg/dL (ref 0.2–1.2)
Total Protein: 6.9 g/dL (ref 6.0–8.3)

## 2021-03-01 LAB — LIPID PANEL
Cholesterol: 171 mg/dL (ref 0–200)
HDL: 53.2 mg/dL (ref >39.00)
LDL Cholesterol: 108 mg/dL — ABNORMAL HIGH (ref 0–99)
NonHDL: 118.08
Total CHOL/HDL Ratio: 3
Triglycerides: 52 mg/dL (ref 0.0–149.0)
VLDL: 10.4 mg/dL (ref 0.0–40.0)

## 2021-03-01 LAB — HEMOGLOBIN A1C: Hgb A1c MFr Bld: 6 % (ref 4.6–6.5)

## 2021-03-01 LAB — VITAMIN D 25 HYDROXY (VIT D DEFICIENCY, FRACTURES): VITD: 54.11 ng/mL (ref 30.00–100.00)

## 2021-03-01 LAB — TSH: TSH: 1.53 u[IU]/mL (ref 0.35–5.50)

## 2021-03-01 NOTE — Progress Notes (Signed)
[place]  [phone]  Fax:([phone]) [place] DOB: 08-13-1965  MR#: [id number]  IYM#:[id number]  Patient Care Team: Burnard Hawthorne, FNP as PCP - General (Family Medicine) Bary Castilla Forest Gleason, MD as Consulting Physician (General Surgery)  I connected with Cynthia Willis on 03/07/21 at  2:45 PM EDT by video enabled telemedicine visit and verified that I am speaking with the correct person using two identifiers.   I discussed the limitations, risks, security and privacy concerns of performing an evaluation and management service by telemedicine and the availability of in-person appointments. I also discussed with the patient that there may be a patient responsible charge related to this service. The patient expressed understanding and agreed to proceed.   Other persons participating in the visit and their role in the encounter: Patient, MD.  Patient's location: Home. Provider's location: Clinic.  CHIEF COMPLAINT: Eosinophilia  INTERVAL HISTORY: Patient agreed to video assisted telemedicine visit for further evaluation and discussion of her laboratory results.  She continues to feel well and remains asymptomatic.  She denies any weakness or fatigue.  She has no neurologic complaints. She denies any recent fevers or illnesses.  She has a good appetite and denies weight loss.  She has no chest pain, shortness of breath, cough, or hemoptysis.  She denies any nausea, vomiting, constipation, or diarrhea.  She has no urinary complaints.  Patient feels at her baseline offers no specific complaints today.  REVIEW OF SYSTEMS:   Review of Systems  Constitutional: Negative.  Negative for fever, malaise/fatigue and weight loss.  Respiratory: Negative.  Negative for cough and shortness of breath.   Cardiovascular: Negative.  Negative for chest pain and leg swelling.  Gastrointestinal: Negative.  Negative for abdominal pain.  Genitourinary: Negative.   Negative for dysuria.  Musculoskeletal: Negative.  Negative for myalgias.  Skin: Negative.  Negative for rash.  Neurological: Negative.  Negative for sensory change, focal weakness, weakness and headaches.  Psychiatric/Behavioral: Negative.  The patient is not nervous/anxious.    As per HPI. Otherwise, a complete review of systems is negative.  ONCOLOGY HISTORY: Oncology History Overview Note  Chief Complaint/Diagnosis:   32. 55 year old female status was wide local excision and sentinel node biopsy for a clinical stage I (T1 C. N0 M0) ER/PR positive HER-2/neu negative invasive mammary carcinoma.PATIENT HAS    FISH RATIO OF 1.67 status post lumpectomy(April, 2013) Oncotype DX.  Lowest risk.  Score of 12 with 8-10% risk over   10 years for recurrent disease 2. Started on tamoxifen from August of 2013. 3. BRCA1 and BRCA2 no mutation identified (May of 2013) 4.Started on Lupron from December 23, 2012 tamoxifen was discontinued been patient was started aromasin 5.bilateral oophorectomy was done in May of 2015 Patient is in postmenopausal status and has been started on Aromasin   Breast cancer, right ([place])  10/27/2012 Initial Diagnosis   Breast cancer     PAST MEDICAL HISTORY: Past Medical History:  Diagnosis Date   Basal cell carcinoma 05/2009   upper chest; Dr. [name]   Breast cancer [facility])    Cancer [facility]) 2013   November 05, 2011 wide excision and sentinel node biopsy for invasive ductal carcinoma involving the right breast.   Carcinoma of right breast ([place]) 12/28/2014   Chicken pox    Colon polyps 2013   hyperplastic polyps x1   Cystitis    Genetic screening 01/30/2017   Invitae, variant of uncertain significant DICER1   Hemorrhoids  History of Papanicolaou smear of cervix 08/09/10; 11/03/13   -/-; -/-   Kidney stones    Malignant neoplasm of upper-outer quadrant of female breast ([facility]) 11/05/2011   T1C, N0, M0; ER 90%, PR 90%, HER-2/neu not over expressed. Oncotype DX score 14:  low risk of recurrence with antiestrogen alone at 8%.   Menorrhagia    Migraines    Personal history of malignant neoplasm of breast 2013   BRCA negative, May 2013   Personal history of radiation therapy 2013   right breast cancer    PAST SURGICAL HISTORY: Past Surgical History:  Procedure Laterality Date   basal cell skin excision  05/2009   Dr. Patterson   BREAST BIOPSY Right 10/2011   bx +    BREAST LUMPECTOMY Right 11/05/2011   November 05, 2011 wide excision and sentinel node biopsy for invasive ductal carcinoma involving the right breast.   BREAST SURGERY Right 2013   Wide excision, mastoplasty, sentinel node biopsy.   COLONOSCOPY  2013   hyperplastic polyp identified in the sigmoid  04/21/2012  , Dr. Skulski   DILATION AND CURETTAGE OF UTERUS  2001   LAPAROSCOPY  11/09/2013   iud removal   LASIK  2013   OOPHORECTOMY Bilateral Nov 09 2013   Dr. Paul Harris    FAMILY HISTORY Family History  Problem Relation Age of Onset   Hyperlipidemia Mother    Heart attack Mother    Colon cancer Father 48        48, 58, 72, 82, currently has at age 87   Heart disease Father        mini stroke   Other Father        lynch syndrome Dx at age 70 ?   Other Sister 55       lynch syndrome   Breast cancer Sister 60   Cancer Maternal Aunt 35       breast; ovary   Breast cancer Maternal Aunt     GYNECOLOGIC HISTORY:  Patient's last menstrual period was 10/26/2012.     ADVANCED DIRECTIVES:    HEALTH MAINTENANCE: Social History   Tobacco Use   Smoking status: Never   Smokeless tobacco: Never  Vaping Use   Vaping Use: Never used  Substance Use Topics   Alcohol use: No   Drug use: No     Colonoscopy:  PAP:  Bone density: 12/2015  Mammogram: 11/2015  No Known Allergies  Current Outpatient Medications  Medication Sig Dispense Refill   aspirin 81 MG tablet Take 81 mg by mouth daily.     Calcium-Magnesium-Vitamin D (CALCIUM MAGNESIUM PO) Take by mouth 2 (two) times daily.      vitamin C (ASCORBIC ACID) 500 MG tablet Take 500 mg by mouth daily.     ZINC MT Use as directed in the mouth or throat.     No current facility-administered medications for this visit.    OBJECTIVE: LMP 10/26/2012    There is no height or weight on file to calculate BMI.    ECOG FS:0 - Asymptomatic  General: Well-developed, well-nourished, no acute distress. HEENT: Normocephalic. Neuro: Alert, answering all questions appropriately. Cranial nerves grossly intact. Psych: Normal affect.    LAB RESULTS: CBC and MetC from [facility] on July 10, 2016 reported within normal limits. CA-27-29 9.2.  STUDIES: No results found.  ASSESSMENT: Eosinophilia.  History of breast cancer.    PLAN:    1.  Eosinophilia: Chronic and unchanged.  Unclear etiology.  Patient   continues to have a persistently elevated eosinophil level.  Her absolute eosinophil level today is 13.7 which is mildly improved from 1 year ago.  Previously, peripheral blood flow cytometry was negative.  Tryptase levels are within normal limits.  No intervention is needed at this time.  Patient does not require bone marrow biopsy.  No further intervention is needed at this time.  No further follow-up has been scheduled.  Recommend checking CBC with differential 1-2 times per year.  Please refer patient back if she becomes symptomatic or there are any questions or concerns. 2.  History of pathologic stage Ia ER/PR positive, HER-2 negative invasive carcinoma of the right breast, unspecified site: Patient is status post lumpectomy, radiation therapy. Oncotype DX score of 12, low risk. She initially started on tamoxifen in August 2013. Patient had a bilateral oophorectomy performed in May 2015 and was switched to Aromasin and completed 5 years of treatment in August 2018.  Her most recent mammogram on December 08, 2019 was reported as BI-RADS 1. 3. Osteopenia: Her most recent bone mineral density on December 08, 2019 reported T score of -2.3 which is  slightly worse than previous.  Continue monitoring and treatment per primary care.    I provided 20 minutes of face-to-face video visit time during this encounter which included chart review, counseling, and coordination of care as documented above.   Patient expressed understanding and was in agreement with this plan. She also understands that She can call clinic at any time with any questions, concerns, or complaints.   Breast cancer [facility])   Staging form: Breast, AJCC 7th Edition     Clinical: Stage IA (T1c, N0, M0) - Unsigned   Lloyd Huger, MD   03/07/2021 6:02 AM

## 2021-03-06 ENCOUNTER — Encounter: Payer: Self-pay | Admitting: Oncology

## 2021-03-06 ENCOUNTER — Inpatient Hospital Stay: Payer: BC Managed Care – PPO | Attending: Oncology | Admitting: Oncology

## 2021-03-06 DIAGNOSIS — D721 Eosinophilia, unspecified: Secondary | ICD-10-CM

## 2021-03-13 DIAGNOSIS — H524 Presbyopia: Secondary | ICD-10-CM | POA: Diagnosis not present

## 2021-07-26 DIAGNOSIS — D225 Melanocytic nevi of trunk: Secondary | ICD-10-CM | POA: Diagnosis not present

## 2021-07-26 DIAGNOSIS — C44311 Basal cell carcinoma of skin of nose: Secondary | ICD-10-CM | POA: Diagnosis not present

## 2021-07-26 DIAGNOSIS — L821 Other seborrheic keratosis: Secondary | ICD-10-CM | POA: Diagnosis not present

## 2021-07-26 DIAGNOSIS — Z85828 Personal history of other malignant neoplasm of skin: Secondary | ICD-10-CM | POA: Diagnosis not present

## 2021-07-26 DIAGNOSIS — L218 Other seborrheic dermatitis: Secondary | ICD-10-CM | POA: Diagnosis not present

## 2021-07-26 DIAGNOSIS — D485 Neoplasm of uncertain behavior of skin: Secondary | ICD-10-CM | POA: Diagnosis not present

## 2021-08-22 DIAGNOSIS — L728 Other follicular cysts of the skin and subcutaneous tissue: Secondary | ICD-10-CM | POA: Diagnosis not present

## 2021-09-13 DIAGNOSIS — C44311 Basal cell carcinoma of skin of nose: Secondary | ICD-10-CM | POA: Diagnosis not present

## 2021-09-13 DIAGNOSIS — L988 Other specified disorders of the skin and subcutaneous tissue: Secondary | ICD-10-CM | POA: Diagnosis not present

## 2021-09-13 DIAGNOSIS — L814 Other melanin hyperpigmentation: Secondary | ICD-10-CM | POA: Diagnosis not present

## 2021-09-13 DIAGNOSIS — L578 Other skin changes due to chronic exposure to nonionizing radiation: Secondary | ICD-10-CM | POA: Diagnosis not present

## 2022-08-08 DIAGNOSIS — D225 Melanocytic nevi of trunk: Secondary | ICD-10-CM | POA: Diagnosis not present

## 2022-08-08 DIAGNOSIS — Z85828 Personal history of other malignant neoplasm of skin: Secondary | ICD-10-CM | POA: Diagnosis not present

## 2022-08-08 DIAGNOSIS — L853 Xerosis cutis: Secondary | ICD-10-CM | POA: Diagnosis not present

## 2022-08-08 DIAGNOSIS — D2262 Melanocytic nevi of left upper limb, including shoulder: Secondary | ICD-10-CM | POA: Diagnosis not present

## 2024-05-25 NOTE — Telephone Encounter (Signed)
 Open in error
